# Patient Record
Sex: Male | Born: 1985
Health system: Southern US, Community
[De-identification: ages and names within clinical notes are randomized; demographics above are authoritative.]

## PROBLEM LIST (undated history)

## (undated) DIAGNOSIS — J45909 Unspecified asthma, uncomplicated: Secondary | ICD-10-CM

## (undated) HISTORY — DX: Unspecified asthma, uncomplicated: J45.909

## (undated) HISTORY — PX: WISDOM TOOTH EXTRACTION: SHX21

---
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
## 2011-11-05 ENCOUNTER — Encounter (HOSPITAL_COMMUNITY): Payer: Self-pay | Admitting: Emergency Medicine

## 2011-11-05 DIAGNOSIS — Z0389 Encounter for observation for other suspected diseases and conditions ruled out: Secondary | ICD-10-CM | POA: Insufficient documentation

## 2011-11-05 NOTE — ED Notes (Signed)
Pt c/o shortness of breath, onset earlier today.  Pt denies asthma.  Productive cough x's 3 days. s

## 2011-11-06 ENCOUNTER — Emergency Department (HOSPITAL_COMMUNITY)
Admission: EM | Admit: 2011-11-06 | Discharge: 2011-11-06 | Discharge status: Against Medical Advice | Payer: Medicaid Other | Attending: Emergency Medicine | Admitting: Emergency Medicine

## 2011-11-06 ENCOUNTER — Emergency Department (HOSPITAL_COMMUNITY): Payer: Medicaid Other

## 2011-11-06 DIAGNOSIS — R0602 Shortness of breath: Secondary | ICD-10-CM

## 2011-11-06 MED ORDER — ALBUTEROL SULFATE (5 MG/ML) 0.5% IN NEBU
5.0000 mg | INHALATION_SOLUTION | Freq: Once | RESPIRATORY_TRACT | Status: AC
  Administered 2011-11-06: 5 mg via RESPIRATORY_TRACT
  Filled 2011-11-06: qty 40

## 2011-11-06 MED ORDER — PREDNISONE 20 MG PO TABS
60.0000 mg | ORAL_TABLET | Freq: Once | ORAL | Status: AC
  Administered 2011-11-06: 60 mg via ORAL
  Filled 2011-11-06: qty 3

## 2011-11-06 NOTE — ED Provider Notes (Signed)
Patient left AMA prior to my evaluation  Olivia Mackie, MD 11/06/11 [phone]

## 2011-11-06 NOTE — ED Notes (Signed)
NEBULIZER TREATMENT IN PROGRESS, BREATHING EASIER ,  RESPIRATIONS UNLABORED .

## 2012-10-23 ENCOUNTER — Emergency Department (HOSPITAL_COMMUNITY): Payer: Medicaid Other

## 2012-10-23 ENCOUNTER — Encounter (HOSPITAL_COMMUNITY): Payer: Self-pay | Admitting: *Deleted

## 2012-10-23 ENCOUNTER — Observation Stay (HOSPITAL_COMMUNITY)
Admission: EM | Admit: 2012-10-23 | Discharge: 2012-10-24 | Disposition: A | Discharge status: Home / Self Care | Payer: Medicaid Other | Attending: Internal Medicine | Admitting: Internal Medicine

## 2012-10-23 DIAGNOSIS — J4551 Severe persistent asthma with (acute) exacerbation: Secondary | ICD-10-CM

## 2012-10-23 DIAGNOSIS — Z23 Encounter for immunization: Secondary | ICD-10-CM | POA: Insufficient documentation

## 2012-10-23 DIAGNOSIS — J45901 Unspecified asthma with (acute) exacerbation: Principal | ICD-10-CM

## 2012-10-23 DIAGNOSIS — J209 Acute bronchitis, unspecified: Secondary | ICD-10-CM

## 2012-10-23 DIAGNOSIS — R0602 Shortness of breath: Secondary | ICD-10-CM | POA: Insufficient documentation

## 2012-10-23 DIAGNOSIS — Z720 Tobacco use: Secondary | ICD-10-CM | POA: Diagnosis present

## 2012-10-23 DIAGNOSIS — J4541 Moderate persistent asthma with (acute) exacerbation: Secondary | ICD-10-CM

## 2012-10-23 DIAGNOSIS — F17200 Nicotine dependence, unspecified, uncomplicated: Secondary | ICD-10-CM

## 2012-10-23 LAB — BASIC METABOLIC PANEL
BUN: 8 mg/dL (ref 6–23)
CO2: 24 mEq/L (ref 19–32)
Chloride: 101 mEq/L (ref 96–112)
Creatinine, Ser: 0.74 mg/dL (ref 0.50–1.35)
Glucose, Bld: 113 mg/dL — ABNORMAL HIGH (ref 70–99)

## 2012-10-23 LAB — CBC WITH DIFFERENTIAL/PLATELET
Basophils Relative: 0 % (ref 0–1)
HCT: 46.5 % (ref 39.0–52.0)
Hemoglobin: 16.9 g/dL (ref 13.0–17.0)
Lymphocytes Relative: 7 % — ABNORMAL LOW (ref 12–46)
Lymphs Abs: 1 10*3/uL (ref 0.7–4.0)
MCHC: 36.3 g/dL — ABNORMAL HIGH (ref 30.0–36.0)
Monocytes Absolute: 0.8 10*3/uL (ref 0.1–1.0)
Monocytes Relative: 5 % (ref 3–12)
Neutro Abs: 13 10*3/uL — ABNORMAL HIGH (ref 1.7–7.7)
RBC: 5.03 MIL/uL (ref 4.22–5.81)

## 2012-10-23 MED ORDER — BUDESONIDE-FORMOTEROL FUMARATE 80-4.5 MCG/ACT IN AERO
2.0000 | INHALATION_SPRAY | Freq: Two times a day (BID) | RESPIRATORY_TRACT | Status: DC
  Administered 2012-10-23 – 2012-10-24 (×2): 2 via RESPIRATORY_TRACT
  Filled 2012-10-23 (×2): qty 6.9

## 2012-10-23 MED ORDER — ALBUTEROL SULFATE (5 MG/ML) 0.5% IN NEBU
5.0000 mg | INHALATION_SOLUTION | Freq: Once | RESPIRATORY_TRACT | Status: AC
  Administered 2012-10-23: 5 mg via RESPIRATORY_TRACT
  Filled 2012-10-23: qty 1

## 2012-10-23 MED ORDER — MAGNESIUM SULFATE 40 MG/ML IJ SOLN
2.0000 g | Freq: Once | INTRAMUSCULAR | Status: AC
  Administered 2012-10-23: 2 g via INTRAVENOUS
  Filled 2012-10-23: qty 50

## 2012-10-23 MED ORDER — ALBUTEROL SULFATE (5 MG/ML) 0.5% IN NEBU
2.5000 mg | INHALATION_SOLUTION | Freq: Four times a day (QID) | RESPIRATORY_TRACT | Status: DC
  Administered 2012-10-23 – 2012-10-24 (×4): 2.5 mg via RESPIRATORY_TRACT
  Filled 2012-10-23 (×4): qty 0.5

## 2012-10-23 MED ORDER — METHYLPREDNISOLONE SODIUM SUCC 125 MG IJ SOLR
125.0000 mg | Freq: Once | INTRAMUSCULAR | Status: AC
  Administered 2012-10-23: 125 mg via INTRAVENOUS
  Filled 2012-10-23: qty 2

## 2012-10-23 MED ORDER — ACETAMINOPHEN 325 MG PO TABS: 650.0000 mg | ORAL_TABLET | Freq: Four times a day (QID) | ORAL | Status: DC | PRN

## 2012-10-23 MED ORDER — PREDNISONE 20 MG PO TABS
40.0000 mg | ORAL_TABLET | Freq: Two times a day (BID) | ORAL | Status: DC
  Administered 2012-10-23 – 2012-10-24 (×2): 40 mg via ORAL
  Filled 2012-10-23 (×4): qty 2

## 2012-10-23 MED ORDER — ACETAMINOPHEN 650 MG RE SUPP: 650.0000 mg | Freq: Four times a day (QID) | RECTAL | Status: DC | PRN

## 2012-10-23 MED ORDER — ALBUTEROL SULFATE (5 MG/ML) 0.5% IN NEBU: 5.0000 mg | INHALATION_SOLUTION | Freq: Once | RESPIRATORY_TRACT | Status: DC

## 2012-10-23 MED ORDER — PNEUMOCOCCAL VAC POLYVALENT 25 MCG/0.5ML IJ INJ
0.5000 mL | INJECTION | INTRAMUSCULAR | Status: AC
  Administered 2012-10-24: 0.5 mL via INTRAMUSCULAR
  Filled 2012-10-23: qty 0.5

## 2012-10-23 MED ORDER — DOXYCYCLINE HYCLATE 100 MG PO CAPS: 100.0000 mg | ORAL_CAPSULE | Freq: Two times a day (BID) | ORAL | Status: DC

## 2012-10-23 MED ORDER — GUAIFENESIN ER 600 MG PO TB12
600.0000 mg | ORAL_TABLET | Freq: Two times a day (BID) | ORAL | Status: DC
  Administered 2012-10-23 – 2012-10-24 (×2): 600 mg via ORAL
  Filled 2012-10-23 (×4): qty 1

## 2012-10-23 MED ORDER — ALBUTEROL SULFATE (5 MG/ML) 0.5% IN NEBU
INHALATION_SOLUTION | RESPIRATORY_TRACT | Status: AC
  Filled 2012-10-23: qty 1

## 2012-10-23 MED ORDER — IPRATROPIUM BROMIDE 0.02 % IN SOLN
0.5000 mg | Freq: Four times a day (QID) | RESPIRATORY_TRACT | Status: DC
  Administered 2012-10-23 – 2012-10-24 (×4): 0.5 mg via RESPIRATORY_TRACT
  Filled 2012-10-23 (×4): qty 2.5

## 2012-10-23 MED ORDER — IPRATROPIUM BROMIDE 0.02 % IN SOLN
0.5000 mg | Freq: Once | RESPIRATORY_TRACT | Status: AC
  Administered 2012-10-23: 0.5 mg via RESPIRATORY_TRACT
  Filled 2012-10-23: qty 2.5

## 2012-10-23 MED ORDER — IPRATROPIUM BROMIDE 0.02 % IN SOLN: 0.5000 mg | Freq: Once | RESPIRATORY_TRACT | Status: DC

## 2012-10-23 MED ORDER — ALBUTEROL (5 MG/ML) CONTINUOUS INHALATION SOLN
10.0000 mg/h | INHALATION_SOLUTION | Freq: Once | RESPIRATORY_TRACT | Status: AC
  Administered 2012-10-23: 10 mg/h via RESPIRATORY_TRACT
  Filled 2012-10-23: qty 20

## 2012-10-23 MED ORDER — ALBUTEROL SULFATE (5 MG/ML) 0.5% IN NEBU
5.0000 mg | INHALATION_SOLUTION | Freq: Once | RESPIRATORY_TRACT | Status: AC
  Administered 2012-10-23: 5 mg via RESPIRATORY_TRACT

## 2012-10-23 NOTE — H&P (Signed)
[facility] History and Physical  William May ZOX:[id number] DOB: 26-Apr-1985 DOA: 10/23/2012  Referring physician: EDP PCP: Thora Lance, MD   Chief Complaint: shortness of breath  HPI: William May is a 27 y.o. male, smoker with a reported history of asthma who presents with wheezing and shortness of breath. He smokes about 2 packs of cigarettes a day. Also he smokes marijuana occasionally. He started feeling a "tickle" in his throat today. He has a cough but is vague about severity or sputum production. He was started on Symbicort and albuterol last week by his primary care provider. He's had no sinus congestion. No fevers or chills. no sick contacts. Never been hospitalized for asthma. Has received Solu-Medrol and several breathing treatments, but continues to have coarse breath sounds and hypoxia. His oxygen saturations were in the high 80s. Currently 96% on 2 L nasal cannula.  Initial bloodwork hemolyzed. Repeat potassium normal  Review of Systems: Systems reviewed and as above otherwise negative per  Family history: Diabetes, hypertension, cancer  Past Surgical History  Procedure Laterality Date  . Wisdom tooth extraction     Social History: Smokes 2 packs of cigarettes daily. Smokes marijuana. Denies heavy alcohol use but does drink occasionally. Unemployed.  No Known Allergies   Prior to Admission medications   Medication Sig Start Date End Date Taking? Authorizing Provider  albuterol (PROVENTIL HFA;VENTOLIN HFA) 108 (90 BASE) MCG/ACT inhaler Inhale 2 puffs into the lungs every 6 (six) hours as needed for wheezing.   Yes Historical Provider, MD  budesonide-formoterol (SYMBICORT) 80-4.5 MCG/ACT inhaler Inhale 2 puffs into the lungs 2 (two) times daily.   Yes Historical Provider, MD   Physical Exam: Filed Vitals:   10/23/12 1430 10/23/12 1530 10/23/12 1640 10/23/12 1719  BP: 108/82 121/68 110/57   Pulse: 118 96 96   Temp:   97.9 F (36.6 C)   TempSrc:    Oral   Resp:   16   SpO2: 100% 95% 94% 93%   BP 110/57  Pulse 96  Temp(Src) 97.9 F (36.6 C) (Oral)  Resp 16  SpO2 93%  General Appearance:    comfortable appearing African American male, very distracted, watching TV. Largely cooperative but not very communicative.   Head:    Normocephalic, without obvious abnormality, atraumatic  Eyes:    PERRL, conjunctiva/corneas clear, EOM's intact, fundi    benign, both eyes          Nose:   Nares normal, septum midline, mucosa normal, no drainage   or sinus tenderness  Throat:   Lips, mucosa, and tongue normal; teeth and gums normal  Neck:   Supple, symmetrical, trachea midline, no adenopathy;       thyroid:  No enlargement/tenderness/nodules; no carotid   bruit or JVD  Back:     coarse. No rales. Minimal wheeze. Good air movement.   Lungs:     Clear to auscultation bilaterally, respirations unlabored  Chest wall:    No tenderness or deformity  Heart:    Regular rate and rhythm, S1 and S2 normal, no murmur, rub   or gallop  Abdomen:     Soft, non-tender, bowel sounds active all four quadrants,    no masses, no organomegaly  Genitalia:   deferred   Rectal:   deferred   Extremities:   Extremities normal, atraumatic, no cyanosis or edema  Pulses:   2+ and symmetric all extremities  Skin:   Skin color, texture, turgor normal, no rashes or lesions  Lymph  nodes:   Cervical, supraclavicular, and axillary nodes normal  Neurologic:   CNII-XII intact. Normal strength, sensation and reflexes      throughout    psychiatric: Cooperative. Flat affect.  Labs on Admission:  Basic Metabolic Panel:  Recent Labs Lab 10/23/12 1305  NA 136  K 6.6*  CL 101  CO2 24  GLUCOSE 113*  BUN 8  CREATININE 0.74  CALCIUM 9.2   Liver Function Tests: No results found for this basename: AST, ALT, ALKPHOS, BILITOT, PROT, ALBUMIN,  in the last 168 hours No results found for this basename: LIPASE, AMYLASE,  in the last 168 hours No results found for this  basename: AMMONIA,  in the last 168 hours CBC:  Recent Labs Lab 10/23/12 1305  WBC 15.2*  NEUTROABS 13.0*  HGB 16.9  HCT 46.5  MCV 92.4  PLT 290   Cardiac Enzymes: No results found for this basename: CKTOTAL, CKMB, CKMBINDEX, TROPONINI,  in the last 168 hours  BNP (last 3 results) No results found for this basename: PROBNP,  in the last 8760 hours CBG: No results found for this basename: GLUCAP,  in the last 168 hours  Radiological Exams on Admission: Dg Chest 2 View  10/23/2012   *RADIOLOGY REPORT*  Clinical Data: Cough and shortness of breath.  CHEST - 2 VIEW  Comparison: Chest x-ray 11/06/2011.  Findings: Lung volumes are normal.  No consolidative airspace disease.  No pleural effusions.  No pneumothorax.  No pulmonary nodule or mass noted.  Pulmonary vasculature and the cardiomediastinal silhouette are within normal limits.  IMPRESSION: 1. No radiographic evidence of acute cardiopulmonary disease.   Original Report Authenticated By: Trudie Reed, M.D.   Assessment/Plan Principal Problem:   Asthma exacerbation Active Problems:   Tobacco abuse Erroneous potassium level: istat K normal  Observation, albuterol and atrovent (heavy smoker), prednisone, O2, mucolytics. Tobacco cessation counseling  Code Status: full Family Communication: grandmother Disposition Plan: home  Time spent: 45 min  Christiane Ha [facility] Pager [phone]  If 7PM-7AM, please contact night-coverage [email or web address] Password [facility] 10/23/2012, 6:38 PM

## 2012-10-23 NOTE — ED Notes (Signed)
Reports not feeling well since yesterday, having upper resp congestion and cough. Denies hx of asthma. Airway intact at triage.

## 2012-10-23 NOTE — ED Provider Notes (Signed)
CSN: [id number]     Arrival date & time 10/23/12  1042 History     First MD Initiated Contact with Patient 10/23/12 1059     Chief Complaint  Patient presents with  . URI   (Consider location/radiation/quality/duration/timing/severity/associated sxs/prior Treatment) HPI Comments: Patient presents with a chief complaint of productive cough and shortness of breath.  He reports that his symptoms began four days ago and have been progressively worsening since that time.   He states that he was seen by his PCP for this four days ago and was given Symbicort and an Albuterol inhaler, which he reports that he has been using.  When asked if he has a history of Asthma he states, "yes but I don't like to admit it."  He denies any prior hospitalizations or intubations.  He denies fever or chills.  Denies chest pain.  Denies hemoptysis.  Denies prolonged travel or surgeries in the past 4 weeks.  No prior history of DVT or PE.  No lower extremity edema or pain.  He smokes daily.   Smokes 2 ppd.    The history is provided by the patient.    History reviewed. No pertinent past medical history. Past Surgical History  Procedure Laterality Date  . Wisdom tooth extraction     History reviewed. No pertinent family history. History  Substance Use Topics  . Smoking status: Current Every Day Smoker  . Smokeless tobacco: Not on file  . Alcohol Use: No    Review of Systems  Constitutional: Negative for fever and chills.  HENT: Positive for congestion and rhinorrhea. Negative for sinus pressure.   Respiratory: Positive for cough and shortness of breath.   Cardiovascular: Negative for chest pain and leg swelling.  All other systems reviewed and are negative.    Allergies  Review of patient's allergies indicates no known allergies.  Home Medications   Current Outpatient Rx  Name  Route  Sig  Dispense  Refill  . albuterol (PROVENTIL HFA;VENTOLIN HFA) 108 (90 BASE) MCG/ACT inhaler   Inhalation  Inhale 2 puffs into the lungs every 6 (six) hours as needed for wheezing.         . budesonide-formoterol (SYMBICORT) 80-4.5 MCG/ACT inhaler   Inhalation   Inhale 2 puffs into the lungs 2 (two) times daily.          BP 136/78  Pulse 89  Temp(Src) 99 F (37.2 C) (Oral)  Resp 22  SpO2 90% Physical Exam  Nursing note and vitals reviewed. Constitutional: He appears well-developed and well-nourished.  HENT:  Head: Normocephalic and atraumatic.  Right Ear: Tympanic membrane and ear canal normal.  Left Ear: Tympanic membrane and ear canal normal.  Nose: Nose normal.  Mouth/Throat: Uvula is midline, oropharynx is clear and moist and mucous membranes are normal.  Neck: Normal range of motion. Neck supple.  Cardiovascular: Normal rate, regular rhythm and normal heart sounds.   Pulmonary/Chest: Effort normal. No accessory muscle usage. Not tachypneic. No respiratory distress. He has decreased breath sounds. He has wheezes. He has no rales. He exhibits no tenderness.  Patient speaking in complete sentences Diffuse expiratory wheezing  Neurological: He is alert.  Skin: Skin is warm and dry.  Psychiatric: He has a normal mood and affect.    ED Course   Procedures (including critical care time)  Labs Reviewed - No data to display No results found. No diagnosis found.  12:30 PM Reassessed patient after second breathing treatment.  Patient continues to have expiratory wheezing.  Oxygen sat 89 on RA.  Will order continuous neb and reassess.  2:12 PM Potassium resulted as 6.6.  However, lab reports that it looked hemolyzed.  Will recheck.  2:30 PM Reassessed patient after continuous neb.  Patient continues to have expiratory wheezing.  Pulse ox continues to be 89 on RA.  3:12 PM Repeat Potassium 3.8  3:13 PM Discussed with Dr. Lendell Caprice with [facility] who has agreed to admit the patient.  MDM  Patient with a history of Asthma presents today with Asthma Exacerbation.   Patient given 125 mg Solumedrol, multiple breathing treatments, and Magnesium in the ED without improvement.  CXR negative.  Patient admitted to [facility] for additional management of Asthma Exacerbation.    Pascal Lux [place], PA-C 10/23/12 1939

## 2012-10-23 NOTE — ED Notes (Signed)
Notified PA, Anne Shutter of wheezing and 89% saturation on room air.

## 2012-10-23 NOTE — ED Notes (Signed)
O2 sat back down to 88% on Room air. Placed back on [place] 2L; improved to 91%.

## 2012-10-23 NOTE — ED Notes (Signed)
Patient returned from X-ray 

## 2012-10-23 NOTE — ED Notes (Signed)
Patient transported to X-ray 

## 2012-10-23 NOTE — ED Notes (Signed)
PA at bedside.

## 2012-10-23 NOTE — ED Notes (Signed)
Charge nurse notified of need move pt  for central monitoring and continuous neb.

## 2012-10-24 DIAGNOSIS — J209 Acute bronchitis, unspecified: Secondary | ICD-10-CM

## 2012-10-24 MED ORDER — LEVOFLOXACIN 500 MG PO TABS: 500.0000 mg | ORAL_TABLET | Freq: Every day | ORAL | Status: DC

## 2012-10-24 MED ORDER — PREDNISONE (PAK) 10 MG PO TABS: 10.0000 mg | ORAL_TABLET | Freq: Every day | ORAL | Status: DC

## 2012-10-24 MED ORDER — GUAIFENESIN ER 600 MG PO TB12: 600.0000 mg | ORAL_TABLET | Freq: Two times a day (BID) | ORAL | Status: DC | PRN

## 2012-10-24 MED ORDER — ALBUTEROL SULFATE HFA 108 (90 BASE) MCG/ACT IN AERS: 2.0000 | INHALATION_SPRAY | RESPIRATORY_TRACT | Status: DC | PRN

## 2012-10-24 MED ORDER — LEVOFLOXACIN 500 MG PO TABS
500.0000 mg | ORAL_TABLET | Freq: Every day | ORAL | Status: DC
  Filled 2012-10-24: qty 1

## 2012-10-24 MED ORDER — ACETAMINOPHEN 325 MG PO TABS: 650.0000 mg | ORAL_TABLET | Freq: Four times a day (QID) | ORAL | Status: DC | PRN

## 2012-10-24 NOTE — ED Provider Notes (Signed)
Medical screening examination/treatment/procedure(s) were conducted as a shared visit with non-physician practitioner(s) and myself.  I personally evaluated the patient during the encounter   Loren Racer, MD 10/24/12 [phone]

## 2012-10-24 NOTE — Progress Notes (Signed)
Patient given discharge instructions and prescriptions.  Patient verbalized understanding of reasons to return.  Will f/u as needed.  Roland Rack, RN

## 2012-10-24 NOTE — Discharge Summary (Signed)
Physician Discharge Summary  William May YQI:[id number] DOB: Aug 20, 1985 DOA: 10/23/2012  PCP: Thora Lance, MD  Admit date: 10/23/2012 Discharge date: 10/24/2012  Discharge Diagnoses:  Principal Problem:   Asthma exacerbation Active Problems:   Tobacco abuse   Acute bronchitis  Discharge Condition: stable  There were no vitals filed for this visit.  History of present illness:  William May is a 27 y.o. male, smoker with a reported history of asthma who presents with wheezing and shortness of breath. He smokes about 2 packs of cigarettes a day. Also he smokes marijuana occasionally. He started feeling a "tickle" in his throat today. He has a cough but is vague about severity or sputum production. He was started on Symbicort and albuterol last week by his primary care provider. He's had no sinus congestion. No fevers or chills. no sick contacts. Never been hospitalized for asthma. Has received Solu-Medrol and several breathing treatments, but continues to have coarse breath sounds and hypoxia. His oxygen saturations were in the high 80s. Currently 96% on 2 L nasal cannula. Initial bloodwork hemolyzed. Repeat potassium normal   Hospital Course:  Observed overnight. Steroids, bronchodilators oxygen continued.  Symptoms of URI reported, so started on abx.  Improved overnight, and safe to discharge.  Discharge Exam: Filed Vitals:   10/23/12 2222 10/23/12 2350 10/24/12 0621 10/24/12 0842  BP: 113/69  131/72   Pulse: 91  84   Temp: 98.6 F (37 C)  98.5 F (36.9 C)   TempSrc: Oral  Oral   Resp: 17  18   SpO2: 93% 93% 93% 92%    General: comfortable. Cardiovascular: RRR without WRR Respiratory: good air movement. occ wheeze  Discharge Instructions  Discharge Orders   Future Orders Complete By Expires     Diet general  As directed     Discharge instructions  As directed     Comments:      Quit smoking    Increase activity slowly  As directed         Medication  List         acetaminophen 325 MG tablet  Commonly known as:  TYLENOL  Take 2 tablets (650 mg total) by mouth every 6 (six) hours as needed.     albuterol 108 (90 BASE) MCG/ACT inhaler  Commonly known as:  PROVENTIL HFA;VENTOLIN HFA  Inhale 2 puffs into the lungs every 4 (four) hours as needed for wheezing.     budesonide-formoterol 80-4.5 MCG/ACT inhaler  Commonly known as:  SYMBICORT  Inhale 2 puffs into the lungs 2 (two) times daily.     guaiFENesin 600 MG 12 hr tablet  Commonly known as:  MUCINEX  Take 1 tablet (600 mg total) by mouth 2 (two) times daily as needed for congestion.     levofloxacin 500 MG tablet  Commonly known as:  LEVAQUIN  Take 1 tablet (500 mg total) by mouth daily.  Start taking on:  10/25/2012     predniSONE 10 MG tablet  Commonly known as:  STERAPRED UNI-PAK  Take 1 tablet (10 mg total) by mouth daily. 4 tablets daily then decrease by 1 tablet every 2 days until off       No Known Allergies     Follow-up Information   Follow up with Thora Lance, MD. (If symptoms worsen)    Contact information:   [address] [place] [address] [phone]        The results of significant diagnostics from this hospitalization (including  imaging, microbiology, ancillary and laboratory) are listed below for reference.    Significant Diagnostic Studies: Dg Chest 2 View  10/23/2012   *RADIOLOGY REPORT*  Clinical Data: Cough and shortness of breath.  CHEST - 2 VIEW  Comparison: Chest x-ray 11/06/2011.  Findings: Lung volumes are normal.  No consolidative airspace disease.  No pleural effusions.  No pneumothorax.  No pulmonary nodule or mass noted.  Pulmonary vasculature and the cardiomediastinal silhouette are within normal limits.  IMPRESSION: 1. No radiographic evidence of acute cardiopulmonary disease.   Original Report Authenticated By: Trudie Reed, M.D.    Microbiology: No results found for this or any previous visit (from the past 240  hour(s)).   Labs: Basic Metabolic Panel:  Recent Labs Lab 10/23/12 1305  NA 136  K 6.6*  CL 101  CO2 24  GLUCOSE 113*  BUN 8  CREATININE 0.74  CALCIUM 9.2   Liver Function Tests: No results found for this basename: AST, ALT, ALKPHOS, BILITOT, PROT, ALBUMIN,  in the last 168 hours No results found for this basename: LIPASE, AMYLASE,  in the last 168 hours No results found for this basename: AMMONIA,  in the last 168 hours CBC:  Recent Labs Lab 10/23/12 1305  WBC 15.2*  NEUTROABS 13.0*  HGB 16.9  HCT 46.5  MCV 92.4  PLT 290   Signed:  Nikhil Osei L  [facility] 10/24/2012, 10:19 AM

## 2013-04-23 ENCOUNTER — Ambulatory Visit: Payer: Medicaid Other | Attending: Internal Medicine | Admitting: Internal Medicine

## 2013-04-23 VITALS — BP 114/75 | HR 85 | Temp 98.9°F | Resp 14 | Ht 66.0 in | Wt 136.2 lb

## 2013-04-23 DIAGNOSIS — J45909 Unspecified asthma, uncomplicated: Secondary | ICD-10-CM

## 2013-04-23 DIAGNOSIS — F17200 Nicotine dependence, unspecified, uncomplicated: Secondary | ICD-10-CM

## 2013-04-23 MED ORDER — BUDESONIDE-FORMOTEROL FUMARATE 80-4.5 MCG/ACT IN AERO: 2.0000 | INHALATION_SPRAY | Freq: Two times a day (BID) | RESPIRATORY_TRACT | Status: DC

## 2013-04-23 MED ORDER — ALBUTEROL SULFATE HFA 108 (90 BASE) MCG/ACT IN AERS: 2.0000 | INHALATION_SPRAY | RESPIRATORY_TRACT | Status: DC | PRN

## 2013-04-23 NOTE — Patient Instructions (Signed)
Nicotine Addiction Nicotine can act as both a stimulant (excites/activates) and a sedative (calms/quiets). Immediately after exposure to nicotine, there is a "kick" caused in part by the drug's stimulation of the adrenal glands and resulting discharge of adrenaline (epinephrine). The rush of adrenaline stimulates the body and causes a sudden release of sugar. This means that smokers are always slightly hyperglycemic. Hyperglycemic means that the blood sugar is high, just like in diabetics. Nicotine also decreases the amount of insulin which helps control sugar levels in the body. There is an increase in blood pressure, breathing, and the rate of heart beats.  In addition, nicotine indirectly causes a release of dopamine in the brain that controls pleasure and motivation. A similar reaction is seen with other drugs of abuse, such as cocaine and heroin. This dopamine release is thought to cause the pleasurable sensations when smoking. In some different cases, nicotine can also create a calming effect, depending on sensitivity of the smoker's nervous system and the dose of nicotine taken. WHAT HAPPENS WHEN NICOTINE IS TAKEN FOR LONG PERIODS OF TIME?  Long-term use of nicotine results in addiction. It is difficult to stop.  Repeated use of nicotine creates tolerance. Higher doses of nicotine are needed to get the "kick." When nicotine use is stopped, withdrawal may last a month or more. Withdrawal may begin within a few hours after the last cigarette. Symptoms peak within the first few days and may lessen within a few weeks. For some people, however, symptoms may last for months or longer. Withdrawal symptoms include:   Irritability.  Craving.  Learning and attention deficits.  Sleep disturbances.  Increased appetite. Craving for tobacco may last for 6 months or longer. Many behaviors done while using nicotine can also play a part in the severity of withdrawal symptoms. For some people, the feel,  smell, and sight of a cigarette and the ritual of obtaining, handling, lighting, and smoking the cigarette are closely linked with the pleasure of smoking. When stopped, they also miss the related behaviors which make the withdrawal or craving worse. While nicotine gum and patches may lessen the drug aspects of withdrawal, cravings often persist. WHAT ARE THE MEDICAL CONSEQUENCES OF NICOTINE USE?  Nicotine addiction accounts for one-third of all cancers. The top cancer caused by tobacco is lung cancer. Lung cancer is the number one cancer killer of both men and women.  Smoking is also associated with cancers of the:  Mouth.  Pharynx.  Larynx.  Esophagus.  Stomach.  Pancreas.  Cervix.  Kidney.  Ureter.  Bladder.  Smoking also causes lung diseases such as lasting (chronic) bronchitis and emphysema.  It worsens asthma in adults and children.  Smoking increases the risk of heart disease, including:  Stroke.  Heart attack.  Vascular disease.  Aneurysm.  Passive or secondary smoke can also increase medical risks including:  Asthma in children.  Sudden Infant Death Syndrome (SIDS).  Additionally, dropped cigarettes are the leading cause of residential fire fatalities.  Nicotine poisoning has been reported from accidental ingestion of tobacco products by children and pets. Death usually results in a few minutes from respiratory failure (when a person stops breathing) caused by paralysis. TREATMENT   Medication. Nicotine replacement medicines such as nicotine gum and the patch are used to stop smoking. These medicines gradually lower the dosage of nicotine in the body. These medicines do not contain the carbon monoxide and other toxins found in tobacco smoke.  Hypnotherapy.  Relaxation therapy.  Nicotine Anonymous (a 12-step support   program). Find times and locations in your local yellow pages. Document Released: 11/21/2003 Document Revised: 06/09/2011 Document  Reviewed: 04/14/2007 [facility] Patient Information 2014 [place], [place].  Asthma, Adult Asthma is a recurring condition in which the airways tighten and narrow. Asthma can make it difficult to breathe. It can cause coughing, wheezing, and shortness of breath. Asthma episodes (also called asthma attacks) range from minor to life-threatening. Asthma cannot be cured, but medicines and lifestyle changes can help control it. CAUSES Asthma is believed to be caused by inherited (genetic) and environmental factors, but its exact cause is unknown. Asthma may be triggered by allergens, lung infections, or irritants in the air. Asthma triggers are different for each person. Common triggers include:   Animal dander.  Dust mites.  Cockroaches.  Pollen from trees or grass.  Mold.  Smoke.  Air pollutants such as dust, household cleaners, hair sprays, aerosol sprays, paint fumes, strong chemicals, or strong odors.  Cold air, weather changes, and winds (which increase molds and pollens in the air).  Strong emotional expressions such as crying or laughing hard.  Stress.  Certain medicines (such as aspirin) or types of drugs (such as beta-blockers).  Sulfites in foods and drinks. Foods and drinks that may contain sulfites include dried fruit, potato chips, and sparkling grape juice.  Infections or inflammatory conditions such as the flu, a cold, or an inflammation of the nasal membranes (rhinitis).  Gastroesophageal reflux disease (GERD).  Exercise or strenuous activity. SYMPTOMS Symptoms may occur immediately after asthma is triggered or many hours later. Symptoms include:  Wheezing.  Excessive nighttime or early morning coughing.  Frequent or severe coughing with a common cold.  Chest tightness.  Shortness of breath. DIAGNOSIS  The diagnosis of asthma is made by a review of your medical history and a physical exam. Tests may also be performed. These may include:  Lung function  studies. These tests show how much air you breath in and out.  Allergy tests.  Imaging tests such as X-rays. TREATMENT  Asthma cannot be cured, but it can usually be controlled. Treatment involves identifying and avoiding your asthma triggers. It also involves medicines. There are 2 classes of medicine used for asthma treatment:   Controller medicines. These prevent asthma symptoms from occurring. They are usually taken every day.  Reliever or rescue medicines. These quickly relieve asthma symptoms. They are used as needed and provide short-term relief. Your health care provider will help you create an asthma action plan. An asthma action plan is a written plan for managing and treating your asthma attacks. It includes a list of your asthma triggers and how they may be avoided. It also includes information on when medicines should be taken and when their dosage should be changed. An action plan may also involve the use of a device called a peak flow meter. A peak flow meter measures how well the lungs are working. It helps you monitor your condition. HOME CARE INSTRUCTIONS   Take medicine as directed by your health care provider. Speak with your health care provider if you have questions about how or when to take the medicines.  Use a peak flow meter as directed by your health care provider. Record and keep track of readings.  Understand and use the action plan to help minimize or stop an asthma attack without needing to seek medical care.  Control your home environment in the following ways to help prevent asthma attacks:  Do not smoke. Avoid being exposed to secondhand smoke.  Change your heating and air conditioning filter regularly.  Limit your use of fireplaces and wood stoves.  Get rid of pests (such as roaches and mice) and their droppings.  Throw away plants if you see mold on them.  Clean your floors and dust regularly. Use unscented cleaning products.  Try to have someone  else vacuum for you regularly. Stay out of rooms while they are being vacuumed and for a short while afterward. If you vacuum, use a dust mask from a hardware store, a double-layered or microfilter vacuum cleaner bag, or a vacuum cleaner with a HEPA filter.  Replace carpet with wood, tile, or vinyl flooring. Carpet can trap dander and dust.  Use allergy-proof pillows, mattress covers, and box spring covers.  Wash bed sheets and blankets every week in hot water and dry them in a dryer.  Use blankets that are made of polyester or cotton.  Clean bathrooms and kitchens with bleach. If possible, have someone repaint the walls in these rooms with mold-resistant paint. Keep out of the rooms that are being cleaned and painted.  Wash hands frequently. SEEK MEDICAL CARE IF:   You have wheezing, shortness of breath, or a cough even if taking medicine to prevent attacks.  The colored mucus you cough up (sputum) is thicker than usual.  Your sputum changes from clear or white to yellow, green, gray, or bloody.  You have any problems that may be related to the medicines you are taking (such as a rash, itching, swelling, or trouble breathing).  You are using a reliever medicine more than 2 3 times per week.  Your peak flow is still at 50 79% of you personal best after following your action plan for 1 hour. SEEK IMMEDIATE MEDICAL CARE IF:   You seem to be getting worse and are unresponsive to treatment during an asthma attack.  You are short of breath even at rest.  You get short of breath when doing very little physical activity.  You have difficulty eating, drinking, or talking due to asthma symptoms.  You develop chest pain.  You develop a fast heartbeat.  You have a bluish color to your lips or fingernails.  You are lightheaded, dizzy, or faint.  Your peak flow is less than 50% of your personal best.  You have a fever or persistent symptoms for more than 2 3 days.  You have a fever  and symptoms suddenly get worse. MAKE SURE YOU:   Understand these instructions.  Will watch your condition.  Will get help right away if you are not doing well or get worse. Document Released: 03/17/2005 Document Revised: 11/17/2012 Document Reviewed: 10/14/2012 [facility] Patient Information 2014 [place], [place].

## 2013-04-23 NOTE — Progress Notes (Unsigned)
Patient ID: William May, male   DOB: [birth year], 28 y.o.   MRN: [id number]   HPI:  28 y.o with asthma, who smokes presents to establish care. Symptoms are controlled with current medications and he is able to exert himself without difficulty.   No Known Allergies Past Medical History  Diagnosis Date  . Asthma     History reviewed. No pertinent family history. History   Social History  . Marital Status: Single    Spouse Name: N/A    Number of Children: N/A  . Years of Education: N/A   Occupational History  . Not on file.   Social History Main Topics  . Smoking status: Current Every Day Smoker -- 2.00 packs/day for 7 years    Types: Cigarettes  . Smokeless tobacco: Not on file  . Alcohol Use: No  . Drug Use: No  . Sexual Activity: Not on file   Other Topics Concern  . Not on file   Social History Narrative  . No narrative on file   Current Outpatient Prescriptions on File Prior to Visit  Medication Sig Dispense Refill  . albuterol (PROVENTIL HFA;VENTOLIN HFA) 108 (90 BASE) MCG/ACT inhaler Inhale 2 puffs into the lungs every 4 (four) hours as needed for wheezing.      . budesonide-formoterol (SYMBICORT) 80-4.5 MCG/ACT inhaler Inhale 2 puffs into the lungs 2 (two) times daily.      Marland Kitchen. acetaminophen (TYLENOL) 325 MG tablet Take 2 tablets (650 mg total) by mouth every 6 (six) hours as needed.       No current facility-administered medications on file prior to visit.    Review of Systems  Constitutional: Negative for fever, chills, diaphoresis, activity change, appetite change and fatigue.  HENT: Negative for ear pain, nosebleeds, congestion, facial swelling, rhinorrhea, neck pain, neck stiffness and ear discharge.  Eyes: Negative for pain, discharge, redness, itching and visual disturbance.  Respiratory: Negative for cough, choking, chest tightness, shortness of breath, wheezing and stridor.  Cardiovascular: Negative for chest pain, palpitations and leg swelling.   Gastrointestinal: Negative for abdominal distention.  Genitourinary: Negative for dysuria, urgency, frequency, hematuria, flank pain, decreased urine volume, difficulty urinating and dyspareunia.  Musculoskeletal: negative for back pain, no joint swelling, arthralgias or gait problem.  Neurological: Negative for dizziness, tremors, seizures, syncope, facial asymmetry, speech difficulty, weakness, light-headedness, numbness and headaches.  Hematological: Negative for adenopathy. Does not bruise/bleed easily.  Psychiatric/Behavioral: Negative for hallucinations, behavioral problems, confusion, dysphoric mood   Objective:   Filed Vitals:   04/23/13 1025  BP: 114/75  Pulse: 85  Temp: 98.9 F (37.2 C)  Resp: 14   Filed Weights   04/23/13 1025  Weight: 136 lb 3.2 oz (61.78 kg)    Physical Exam ______ Constitutional: Appears well-developed and well-nourished. No distress. ____ HENT: Normocephalic. External right and left ear normal. Oropharynx is clear and moist. ____ Eyes: Conjunctivae and EOM are normal. PERRLA, no scleral icterus. ____ Neck: Normal ROM. Neck supple. No JVD. No tracheal deviation. No thyromegaly. ____ CVS: RRR, S1/S2 +, no murmurs, no gallops, no carotid bruit.  Pulmonary: Effort and breath sounds normal, no stridor, rhonchi, wheezes, rales.  Abdominal: Soft. BS +,  no distension, tenderness, rebound or guarding. ________ Musculoskeletal: Normal range of motion. No edema and no tenderness. ____ Neuro: Alert. Normal reflexes, muscle tone coordination. No cranial nerve deficit. Skin: Skin is warm and dry. No rash noted. Not diaphoretic. No erythema. No pallor. ____ Psychiatric: Normal mood and affect. Behavior, judgment, thought content  normal. __  Lab Results  Component Value Date   WBC 15.2* 10/23/2012   HGB 16.9 10/23/2012   HCT 46.5 10/23/2012   MCV 92.4 10/23/2012   PLT 290 10/23/2012   Lab Results  Component Value Date   CREATININE 0.74 10/23/2012   BUN 8  10/23/2012   NA 136 10/23/2012   K 6.6* 10/23/2012   CL 101 10/23/2012   CO2 24 10/23/2012    No results found for this basename: HGBA1C   Lipid Panel  No results found for this basename: chol, trig, hdl, cholhdl, vldl, ldlcalc        Patient Active Problem List   Diagnosis Date Noted  . Acute bronchitis 10/24/2012  . Asthma exacerbation 10/23/2012  . Tobacco abuse 10/23/2012     Preventative Medicine:   Flu vaccine: received  LAB WORK: declined    Assessment and plan:  Asthma - controlled- cont current meds- will give refills   Nicotine abuse - advised about complications of smoking and strongly encouraged to quit - pt appears agreeable to try to quit  Hyperkalemia - noted on labs in July- has declined to have it rechecked  - RTC as needed    Calvert Cantor, MD

## 2013-04-23 NOTE — Progress Notes (Unsigned)
Pt is here to establish care. Pt complains of shortness off breath last about 5 minutes and goes away x2 months. Currently using inhalers.

## 2014-09-07 ENCOUNTER — Encounter: Payer: Self-pay | Admitting: Family Medicine

## 2014-09-07 ENCOUNTER — Ambulatory Visit: Payer: Medicaid Other | Attending: Family Medicine | Admitting: Family Medicine

## 2014-09-07 VITALS — BP 131/83 | HR 68 | Temp 98.1°F | Resp 18 | Wt 130.0 lb

## 2014-09-07 DIAGNOSIS — Z720 Tobacco use: Secondary | ICD-10-CM

## 2014-09-07 DIAGNOSIS — Z114 Encounter for screening for human immunodeficiency virus [HIV]: Secondary | ICD-10-CM | POA: Diagnosis not present

## 2014-09-07 DIAGNOSIS — Z0000 Encounter for general adult medical examination without abnormal findings: Secondary | ICD-10-CM | POA: Diagnosis not present

## 2014-09-07 DIAGNOSIS — J45909 Unspecified asthma, uncomplicated: Secondary | ICD-10-CM

## 2014-09-07 DIAGNOSIS — Z23 Encounter for immunization: Secondary | ICD-10-CM

## 2014-09-07 MED ORDER — ALBUTEROL SULFATE HFA 108 (90 BASE) MCG/ACT IN AERS: 2.0000 | INHALATION_SPRAY | RESPIRATORY_TRACT | Status: DC | PRN

## 2014-09-07 MED ORDER — BUDESONIDE-FORMOTEROL FUMARATE 80-4.5 MCG/ACT IN AERO: 2.0000 | INHALATION_SPRAY | Freq: Two times a day (BID) | RESPIRATORY_TRACT | Status: DC

## 2014-09-07 NOTE — Progress Notes (Signed)
   Subjective:    Patient ID: William May, male    DOB: Feb 02, 1986, 29 y.o.   MRN: [id number] CC: asthma, smoker  HPI  1. Asthma: smoker. No wheezing or coughing. Denies daytime and nighttime symptoms. Reports that his asthma is well controlled.   2. Smoking: desires to quit. Smokes 2 PPD. Gets SOB when he sleeps on his back. Otherwise no SOB.   Soc Hx: current smoker 2 ppd  Review of Systems  Constitutional: Negative for fever and chills.  HENT: Negative for rhinorrhea.   Respiratory: Negative for cough, chest tightness, shortness of breath and wheezing.   Cardiovascular: Negative for chest pain.      Objective:   Physical Exam BP 131/83 mmHg  Pulse 68  Temp(Src) 98.1 F (36.7 C) (Oral)  Resp 18  Wt 130 lb (58.968 kg)  SpO2 96% General appearance: alert, cooperative and no distress Nose: no discharge, turbinates pink, swollen Throat: lips, mucosa, and tongue normal; teeth and gums normal Neck: no adenopathy, supple, symmetrical, trachea midline and thyroid not enlarged, symmetric, no tenderness/mass/nodules Lungs: clear to auscultation bilaterally Heart: regular rate and rhythm, S1, S2 normal, no murmur, click, rub or gallop       Assessment & Plan:

## 2014-09-07 NOTE — Patient Instructions (Addendum)
Mr. Markuson,  Thank you for coming in today. It was a pleasure meeting you. I look forward to being your primary doctor.  1. Smoking: heavier smoker, desires to quit.  Think about chantix, zyban, nicotine replacement  Smoking cessation support: smoking cessation hotline: 1-800-QUIT-NOW.  Smoking cessation classes are available through [facility]. Call [phone] or visit our website at [email or web address].   2. Asthma: well controlled Refilled albuterol for as needed use Refilled symbicort  3. Healthcare maintenance: tdap today Screening HIV today  F/u in 3 months for RN visit, flu shot  F/u in 6 months, sooner if needed for asthma   Dr. Armen Pickup

## 2014-09-07 NOTE — Assessment & Plan Note (Signed)
Smoking: heavier smoker, desires to quit.  Think about chantix, zyban, nicotine replacement  Smoking cessation support: smoking cessation hotline: 1-800-QUIT-NOW.  Smoking cessation classes are available through [facility]. Call [phone] or visit our website at [email or web address].

## 2014-09-07 NOTE — Assessment & Plan Note (Signed)
Asthma: well controlled Refilled albuterol for as needed use Refilled symbicort  3. Healthcare maintenance: tdap today Screening HIV today

## 2014-09-07 NOTE — Progress Notes (Signed)
Pt is here to refill his asthma medications Voices no new concerns Alert, no signs of acute distress.

## 2014-09-08 LAB — HIV ANTIBODY (ROUTINE TESTING W REFLEX): HIV 1&2 Ab, 4th Generation: NONREACTIVE

## 2014-09-12 ENCOUNTER — Telehealth: Payer: Self-pay | Admitting: *Deleted

## 2014-09-12 NOTE — Telephone Encounter (Signed)
-----   Message from Dessa Phi, MD sent at 09/08/2014  9:20 AM EDT ----- Screening HIV negative

## 2014-09-12 NOTE — Telephone Encounter (Signed)
Pt aware of results 

## 2015-01-04 ENCOUNTER — Encounter: Payer: Self-pay | Admitting: Family Medicine

## 2015-01-04 ENCOUNTER — Ambulatory Visit: Payer: Medicaid Other | Attending: Family Medicine | Admitting: Family Medicine

## 2015-01-04 VITALS — BP 116/80 | HR 68 | Temp 98.0°F | Resp 16 | Ht 66.0 in | Wt 134.0 lb

## 2015-01-04 DIAGNOSIS — F17210 Nicotine dependence, cigarettes, uncomplicated: Secondary | ICD-10-CM | POA: Insufficient documentation

## 2015-01-04 DIAGNOSIS — G4700 Insomnia, unspecified: Secondary | ICD-10-CM | POA: Diagnosis present

## 2015-01-04 DIAGNOSIS — Z23 Encounter for immunization: Secondary | ICD-10-CM | POA: Insufficient documentation

## 2015-01-04 DIAGNOSIS — G4726 Circadian rhythm sleep disorder, shift work type: Secondary | ICD-10-CM | POA: Diagnosis not present

## 2015-01-04 NOTE — Progress Notes (Signed)
C/C unable to sleep  No pain today  Hx tobacco 1ppday

## 2015-01-04 NOTE — Patient Instructions (Addendum)
You have be conditioned to sleep during the early morning into the afternoon. You have to re-condition your body and mind to sleep at night.   Melatonin or tryptophan are safe OTC natural sleep aids  You can also try benadryl 250 mg at night around 9 PM.   It will take work but choose a night where you wake up at 8 AM regardless of when you fell asleep, stay up all day then take sleep aid around 9 PM if needed. This will help train your body to sleep at night and to be awake in the morning and afternoon.   Sleep hygiene - When possible, the sleep environment should be designed to promote consolidated  sleep, with particular attention to light, temperature, and noise level.  Light-blocking shades should be used to reduce as much light as possible, with cool ambient temperature (generally between 60 to 5F).   If ambient noise cannot be controlled, a white noise machine may help reduce arousals that are due to variability in ambient noise.  F/u in 3 months for check of sleep   Dr. Armen Pickup

## 2015-01-04 NOTE — Assessment & Plan Note (Addendum)
A: Patient sleeps from 3 AM-12 PM for over one year. Not working or in school. No daily obligations.  P: Discussed sleep hygiene Information provided OTC sleep aid

## 2015-01-04 NOTE — Progress Notes (Signed)
Subjective:  Patient ID: William May, male    DOB: 09-20-1985  Age: 29 y.o. MRN: [id number]  CC: Insomnia   HPI William May presents for   1. Insomnia: for over one year. Not depressed. Feels anxious sometimes. Feels tired around 2:30-3:00 PM. Fall asleep around 3 AM wake up 12:30-1:00 PM. Feels well rested in the afternoon. During the afternoon/evening he relaxes throughout the day. Goes for a walk. Does not feel SOB when walking. Not working or in school. He has not worked for years but when he did work her worked 3rd shift. He lives alone. He reports he does not snore.   Social History  Substance Use Topics  . Smoking status: Current Every Day Smoker -- 0.20 packs/day for 7 years    Types: Cigarettes  . Smokeless tobacco: Not on file  . Alcohol Use: No    Outpatient Prescriptions Prior to Visit  Medication Sig Dispense Refill  . acetaminophen (TYLENOL) 325 MG tablet Take 2 tablets (650 mg total) by mouth every 6 (six) hours as needed. (Patient not taking: Reported on 09/07/2014)    . albuterol (PROVENTIL HFA;VENTOLIN HFA) 108 (90 BASE) MCG/ACT inhaler Inhale 2 puffs into the lungs every 4 (four) hours as needed for wheezing. 1 Inhaler 12  . budesonide-formoterol (SYMBICORT) 80-4.5 MCG/ACT inhaler Inhale 2 puffs into the lungs 2 (two) times daily. 1 Inhaler 12   No facility-administered medications prior to visit.    ROS Review of Systems  Constitutional: Negative for fever, chills, fatigue and unexpected weight change.  Eyes: Negative for visual disturbance.  Respiratory: Negative for cough and shortness of breath.   Cardiovascular: Negative for chest pain, palpitations and leg swelling.  Gastrointestinal: Negative for nausea, vomiting, abdominal pain, diarrhea, constipation and blood in stool.  Endocrine: Negative for polydipsia, polyphagia and polyuria.  Musculoskeletal: Negative for myalgias, back pain, arthralgias, gait problem and neck pain.  Skin: Negative  for rash.  Allergic/Immunologic: Negative for immunocompromised state.  Hematological: Negative for adenopathy. Does not bruise/bleed easily.  Psychiatric/Behavioral: Positive for sleep disturbance. Negative for suicidal ideas and dysphoric mood. The patient is not nervous/anxious.   GAD-7: score of 8. 1-1,3,4,7. 2-2,6.   Objective:  BP 116/80 mmHg  Pulse 68  Temp(Src) 98 F (36.7 C) (Oral)  Resp 16  Wt 134 lb (60.782 kg)  SpO2 97%  BP/Weight 01/04/2015 09/07/2014 04/23/2013  Systolic BP 116 131 114  Diastolic BP 80 83 75  Wt. (Lbs) 134 130 136.2  BMI 21.64 20.99 21.99   Physical Exam  Constitutional: He appears well-developed and well-nourished. No distress.  HENT:  Head: Normocephalic and atraumatic.  Nose: Mucosal edema present.  Neck: Normal range of motion. Neck supple.  Cardiovascular: Normal rate, regular rhythm, normal heart sounds and intact distal pulses.   Pulmonary/Chest: Effort normal and breath sounds normal.  Musculoskeletal: He exhibits no edema.  Neurological: He is alert.  Skin: Skin is warm and dry. No rash noted. No erythema.  Psychiatric: He has a normal mood and affect.     Assessment & Plan:   Problem List Items Addressed This Visit    Persistent circadian rhythm sleep disorder, shift work type - Primary (Chronic)    A: Patient sleeps from 3 AM-12 PM for over one year. Not working or in school. No daily obligations.  P: Discussed sleep hygiene Information provided OTC sleep aid        Relevant Orders   Flu Vaccine QUAD 36+ mos IM (Completed)  No orders of the defined types were placed in this encounter.    Follow-up: No Follow-up on file.   Boykin Nearing MD

## 2015-01-23 ENCOUNTER — Telehealth: Payer: Self-pay | Admitting: Clinical

## 2015-01-23 NOTE — Telephone Encounter (Signed)
Left HIPPA-compliant message to call back Carlethia Mesquita at [facility] at [phone] 

## 2015-12-30 ENCOUNTER — Other Ambulatory Visit: Payer: Self-pay | Admitting: Family Medicine

## 2015-12-30 DIAGNOSIS — J45909 Unspecified asthma, uncomplicated: Secondary | ICD-10-CM

## 2016-03-10 ENCOUNTER — Ambulatory Visit: Payer: Medicaid Other | Attending: Family Medicine | Admitting: Internal Medicine

## 2016-03-10 VITALS — BP 130/86 | HR 97 | Resp 28

## 2016-03-10 DIAGNOSIS — Z720 Tobacco use: Secondary | ICD-10-CM

## 2016-03-10 DIAGNOSIS — J4541 Moderate persistent asthma with (acute) exacerbation: Secondary | ICD-10-CM

## 2016-03-10 DIAGNOSIS — J45909 Unspecified asthma, uncomplicated: Secondary | ICD-10-CM

## 2016-03-10 MED ORDER — PREDNISONE 20 MG PO TABS: 20.0000 mg | ORAL_TABLET | Freq: Every day | ORAL | 0 refills | Status: DC

## 2016-03-10 MED ORDER — ALBUTEROL SULFATE HFA 108 (90 BASE) MCG/ACT IN AERS: 2.0000 | INHALATION_SPRAY | RESPIRATORY_TRACT | 3 refills | Status: DC | PRN

## 2016-03-10 MED ORDER — BUDESONIDE-FORMOTEROL FUMARATE 80-4.5 MCG/ACT IN AERO: 2.0000 | INHALATION_SPRAY | Freq: Two times a day (BID) | RESPIRATORY_TRACT | 3 refills | Status: DC

## 2016-03-10 MED ORDER — METHYLPREDNISOLONE SODIUM SUCC 125 MG IJ SOLR
125.0000 mg | Freq: Once | INTRAMUSCULAR | Status: AC
  Administered 2016-03-10: 125 mg via INTRAMUSCULAR

## 2016-03-10 MED ORDER — ALBUTEROL SULFATE (2.5 MG/3ML) 0.083% IN NEBU
2.5000 mg | INHALATION_SOLUTION | Freq: Once | RESPIRATORY_TRACT | Status: AC
  Administered 2016-03-10: 2.5 mg via RESPIRATORY_TRACT

## 2016-03-10 MED FILL — PROAIR HFA 90 MCG INHALER: 108 (90 BAS | 25 days supply | Qty: 9 | Fill #0

## 2016-03-10 MED FILL — SYMBICORT 80-4.5 MCG INH: 80-4.5 | 30 days supply | Qty: 10 | Fill #0

## 2016-03-10 MED FILL — predniSONE 20 MG TABS: 20 | 5 days supply | Qty: 5 | Fill #0

## 2016-03-10 NOTE — Progress Notes (Signed)
William ShorterMichael May, is a 30 y.o. male  [id number]  [id number]  DOB - [birth year]  No chief complaint on file.      Subjective:   William ShorterMichael May is a 30 y.o. male here today as a walk-in. Patient has been lost to follow-up for over a year, walking into the clinic today with shortness of breath in apparent acute asthma attack. Patient had broken speech, saturating 89% on room air, with repeated cough. Patient said he is out of all the inhalers for months, and he continues to smoke cigarette. He was accompanied to the clinic today by his mom. He woke up this morning with shortness of breath and wheezing, he is so short of breath that he thinks if he goes to the ED, having to wait before seen a doctor or before any intervention may be dangerous for him, so he came to the clinic. He denies any fever. He denies any inciting factor. Patient has No headache, No chest pain, No abdominal pain - No Nausea, No new weakness tingling or numbness.  No problems updated.  ALLERGIES: No Known Allergies  PAST MEDICAL HISTORY: Past Medical History:  Diagnosis Date  . Asthma     MEDICATIONS AT HOME: Prior to Admission medications   Medication Sig Start Date End Date Taking? Authorizing Provider  albuterol (PROVENTIL HFA) 108 (90 Base) MCG/ACT inhaler Inhale 2 puffs into the lungs every 4 (four) hours as needed for wheezing or shortness of breath. 03/10/16   Quentin Angstlugbemiga E Baer Hinton, MD  budesonide-formoterol (SYMBICORT) 80-4.5 MCG/ACT inhaler Inhale 2 puffs into the lungs 2 (two) times daily. 03/10/16   Quentin Angstlugbemiga E Quintina Hakeem, MD  predniSONE (DELTASONE) 20 MG tablet Take 1 tablet (20 mg total) by mouth daily with breakfast. 03/10/16   Quentin Angstlugbemiga E Rashad Auld, MD    Objective:  There were no vitals filed for this visit. Exam General appearance : Awake, alert, in mild to moderate respiratory distress, using accessory resp muscles plus flaring of the ala nasi. Speech Clear but broken.  HEENT: Atraumatic  and Normocephalic, pupils equally reactive to light and accomodation Neck: Supple, no JVD. No cervical lymphadenopathy.  Chest: Reduced entry bilaterally, with wheezes, no crepitations. CVS: S1 S2 regular, tachycardia, no murmurs.  Abdomen: Bowel sounds present, Non tender and not distended with no gaurding, rigidity or rebound. Extremities: B/L Lower Ext shows no edema, both legs are warm to touch Neurology: Awake alert, and oriented X 3, CN II-XII intact, Non focal Skin: No Rash  Data Review No results found for: HGBA1C  Assessment & Plan   1. Moderate persistent asthma with acute exacerbation  - Patient was immediately groomed, started on albuterol nebulization, was given IM Solu-Medrol 125 mg stat - Patient brought to better after the first nebulizer treatment, saturating 93% on room air, her lungs became clear with minimal wheezes. - budesonide-formoterol (SYMBICORT) 80-4.5 MCG/ACT inhaler; Inhale 2 puffs into the lungs 2 (two) times daily.  Dispense: 1 Inhaler; Refill: 3 - albuterol (PROVENTIL HFA) 108 (90 Base) MCG/ACT inhaler; Inhale 2 puffs into the lungs every 4 (four) hours as needed for wheezing or shortness of breath.  Dispense: 1 Inhaler; Refill: 3 - predniSONE (DELTASONE) 20 MG tablet; Take 1 tablet (20 mg total) by mouth daily with breakfast.  Dispense: 5 tablet; Refill: 0  2. Tobacco abuse  William NeedleMichael was counseled on the dangers of tobacco use, and was advised to quit. Reviewed strategies to maximize success, including removing cigarettes and smoking materials from environment, stress management and support of  family/friends.   3. Asthma, chronic, unspecified asthma severity, uncomplicated  - budesonide-formoterol (SYMBICORT) 80-4.5 MCG/ACT inhaler; Inhale 2 puffs into the lungs 2 (two) times daily.  Dispense: 1 Inhaler; Refill: 3 - albuterol (PROVENTIL HFA) 108 (90 Base) MCG/ACT inhaler; Inhale 2 puffs into the lungs every 4 (four) hours as needed for wheezing or  shortness of breath.  Dispense: 1 Inhaler; Refill: 3   Patient have been counseled extensively about nutrition and exercise. Other issues discussed during this visit include: low cholesterol diet, weight control and daily exercise, and importance of adherence with medications and regular follow-up.   Return in about 4 weeks (around 04/07/2016) for Asthma.  The patient was given clear instructions to go to ER or return to medical center if symptoms don't improve, worsen or new problems develop. The patient verbalized understanding. The patient was told to call to get lab results if they haven't heard anything in the next week.   This note has been created with Education officer, environmentalDragon speech recognition software and smart phrase technology. Any transcriptional errors are unintentional.    Jeanann LewandowskyJEGEDE, Marckus Hanover, MD, MHA, FACP, FAAP, CPE [facility] [place], [place] [phone]   03/10/2016, 2:02 PM

## 2016-03-10 NOTE — Patient Instructions (Signed)
Steps to Quit Smoking Smoking tobacco can be bad for your health. It can also affect almost every organ in your body. Smoking puts you and people around you at risk for many serious long-lasting (chronic) diseases. Quitting smoking is hard, but it is one of the best things that you can do for your health. It is never too late to quit. What are the benefits of quitting smoking? When you quit smoking, you lower your risk for getting serious diseases and conditions. They can include:  Lung cancer or lung disease.  Heart disease.  Stroke.  Heart attack.  Not being able to have children (infertility).  Weak bones (osteoporosis) and broken bones (fractures). If you have coughing, wheezing, and shortness of breath, those symptoms may get better when you quit. You may also get sick less often. If you are pregnant, quitting smoking can help to lower your chances of having a baby of low birth weight. What can I do to help me quit smoking? Talk with your doctor about what can help you quit smoking. Some things you can do (strategies) include:  Quitting smoking totally, instead of slowly cutting back how much you smoke over a period of time.  Going to in-person counseling. You are more likely to quit if you go to many counseling sessions.  Using resources and support systems, such as:  Online chats with a counselor.  Phone quitlines.  Printed self-help materials.  Support groups or group counseling.  Text messaging programs.  Mobile phone apps or applications.  Taking medicines. Some of these medicines may have nicotine in them. If you are pregnant or breastfeeding, do not take any medicines to quit smoking unless your doctor says it is okay. Talk with your doctor about counseling or other things that can help you. Talk with your doctor about using more than one strategy at the same time, such as taking medicines while you are also going to in-person counseling. This can help make quitting  easier. What things can I do to make it easier to quit? Quitting smoking might feel very hard at first, but there is a lot that you can do to make it easier. Take these steps:  Talk to your family and friends. Ask them to support and encourage you.  Call phone quitlines, reach out to support groups, or work with a counselor.  Ask people who smoke to not smoke around you.  Avoid places that make you want (trigger) to smoke, such as:  Bars.  Parties.  Smoke-break areas at work.  Spend time with people who do not smoke.  Lower the stress in your life. Stress can make you want to smoke. Try these things to help your stress:  Getting regular exercise.  Deep-breathing exercises.  Yoga.  Meditating.  Doing a body scan. To do this, close your eyes, focus on one area of your body at a time from head to toe, and notice which parts of your body are tense. Try to relax the muscles in those areas.  Download or buy apps on your mobile phone or tablet that can help you stick to your quit plan. There are many free apps, such as QuitGuide from the CDC (Centers for Disease Control and Prevention). You can find more support from smokefree.gov and other websites. This information is not intended to replace advice given to you by your health care provider. Make sure you discuss any questions you have with your health care provider. Document Released: 01/11/2009 Document Revised: 11/13/2015 Document   Reviewed: 08/01/2014 Elsevier Interactive Patient Education  2017 Elsevier Inc. Asthma, Acute Bronchospasm Acute bronchospasm caused by asthma is also referred to as an asthma attack. Bronchospasm means your air passages become narrowed. The narrowing is caused by inflammation and tightening of the muscles in the air tubes (bronchi) in your lungs. This can make it hard to breathe or cause you to wheeze and cough. What are the causes? Possible triggers are:  Animal dander from the skin, hair, or  feathers of animals.  Dust mites contained in house dust.  Cockroaches.  Pollen from trees or grass.  Mold.  Cigarette or tobacco smoke.  Air pollutants such as dust, household cleaners, hair sprays, aerosol sprays, paint fumes, strong chemicals, or strong odors.  Cold air or weather changes. Cold air may trigger inflammation. Winds increase molds and pollens in the air.  Strong emotions such as crying or laughing hard.  Stress.  Certain medicines such as aspirin or beta-blockers.  Sulfites in foods and drinks, such as dried fruits and wine.  Infections or inflammatory conditions, such as a flu, cold, or inflammation of the nasal membranes (rhinitis).  Gastroesophageal reflux disease (GERD). GERD is a condition where stomach acid backs up into your esophagus.  Exercise or strenuous activity. What are the signs or symptoms?  Wheezing.  Excessive coughing, particularly at night.  Chest tightness.  Shortness of breath. How is this diagnosed? Your health care provider will ask you about your medical history and perform a physical exam. A chest X-ray or blood testing may be performed to look for other causes of your symptoms or other conditions that may have triggered your asthma attack. How is this treated? Treatment is aimed at reducing inflammation and opening up the airways in your lungs. Most asthma attacks are treated with inhaled medicines. These include quick relief or rescue medicines (such as bronchodilators) and controller medicines (such as inhaled corticosteroids). These medicines are sometimes given through an inhaler or a nebulizer. Systemic steroid medicine taken by mouth or given through an IV tube also can be used to reduce the inflammation when an attack is moderate or severe. Antibiotic medicines are only used if a bacterial infection is present. Follow these instructions at home:  Rest.  Drink plenty of liquids. This helps the mucus to remain thin and be  easily coughed up. Only use caffeine in moderation and do not use alcohol until you have recovered from your illness.  Do not smoke. Avoid being exposed to secondhand smoke.  You play a critical role in keeping yourself in good health. Avoid exposure to things that cause you to wheeze or to have breathing problems.  Keep your medicines up-to-date and available. Carefully follow your health care provider's treatment plan.  Take your medicine exactly as prescribed.  When pollen or pollution is bad, keep windows closed and use an air conditioner or go to places with air conditioning.  Asthma requires careful medical care. See your health care provider for a follow-up as advised. If you are more than [redacted] weeks pregnant and you were prescribed any new medicines, let your obstetrician know about the visit and how you are doing. Follow up with your health care provider as directed.  After you have recovered from your asthma attack, make an appointment with your outpatient doctor to talk about ways to reduce the likelihood of future attacks. If you do not have a doctor who manages your asthma, make an appointment with a primary care doctor to discuss your asthma. Get  help right away if:  You are getting worse.  You have trouble breathing. If severe, call your local emergency services (911 in the [place]  You develop chest pain or discomfort.  You are vomiting.  You are not able to keep fluids down.  You are coughing up yellow, green, brown, or bloody sputum.  You have a fever and your symptoms suddenly get worse.  You have trouble swallowing. This information is not intended to replace advice given to you by your health care provider. Make sure you discuss any questions you have with your health care provider. Document Released: 07/02/2006 Document Revised: 08/29/2015 Document Reviewed: 09/22/2012 Elsevier Interactive Patient Education  2017 Elsevier Inc. Asthma, Adult Asthma is a  recurring condition in which the airways tighten and narrow. Asthma can make it difficult to breathe. It can cause coughing, wheezing, and shortness of breath. Asthma episodes, also called asthma attacks, range from minor to life-threatening. Asthma cannot be cured, but medicines and lifestyle changes can help control it. What are the causes? Asthma is believed to be caused by inherited (genetic) and environmental factors, but its exact cause is unknown. Asthma may be triggered by allergens, lung infections, or irritants in the air. Asthma triggers are different for each person. Common triggers include:  Animal dander.  Dust mites.  Cockroaches.  Pollen from trees or grass.  Mold.  Smoke.  Air pollutants such as dust, household cleaners, hair sprays, aerosol sprays, paint fumes, strong chemicals, or strong odors.  Cold air, weather changes, and winds (which increase molds and pollens in the air).  Strong emotional expressions such as crying or laughing hard.  Stress.  Certain medicines (such as aspirin) or types of drugs (such as beta-blockers).  Sulfites in foods and drinks. Foods and drinks that may contain sulfites include dried fruit, potato chips, and sparkling grape juice.  Infections or inflammatory conditions such as the flu, a cold, or an inflammation of the nasal membranes (rhinitis).  Gastroesophageal reflux disease (GERD).  Exercise or strenuous activity. What are the signs or symptoms? Symptoms may occur immediately after asthma is triggered or many hours later. Symptoms include:  Wheezing.  Excessive nighttime or early morning coughing.  Frequent or severe coughing with a common cold.  Chest tightness.  Shortness of breath. How is this diagnosed? The diagnosis of asthma is made by a review of your medical history and a physical exam. Tests may also be performed. These may include:  Lung function studies. These tests show how much air you breathe in and  out.  Allergy tests.  Imaging tests such as X-rays. How is this treated? Asthma cannot be cured, but it can usually be controlled. Treatment involves identifying and avoiding your asthma triggers. It also involves medicines. There are 2 classes of medicine used for asthma treatment:  Controller medicines. These prevent asthma symptoms from occurring. They are usually taken every day.  Reliever or rescue medicines. These quickly relieve asthma symptoms. They are used as needed and provide short-term relief. Your health care provider will help you create an asthma action plan. An asthma action plan is a written plan for managing and treating your asthma attacks. It includes a list of your asthma triggers and how they may be avoided. It also includes information on when medicines should be taken and when their dosage should be changed. An action plan may also involve the use of a device called a peak flow meter. A peak flow meter measures how well the lungs are working.  It helps you monitor your condition. Follow these instructions at home:  Take medicines only as directed by your health care provider. Speak with your health care provider if you have questions about how or when to take the medicines.  Use a peak flow meter as directed by your health care provider. Record and keep track of readings.  Understand and use the action plan to help minimize or stop an asthma attack without needing to seek medical care.  Control your home environment in the following ways to help prevent asthma attacks:  Do not smoke. Avoid being exposed to secondhand smoke.  Change your heating and air conditioning filter regularly.  Limit your use of fireplaces and wood stoves.  Get rid of pests (such as roaches and mice) and their droppings.  Throw away plants if you see mold on them.  Clean your floors and dust regularly. Use unscented cleaning products.  Try to have someone else vacuum for you regularly.  Stay out of rooms while they are being vacuumed and for a short while afterward. If you vacuum, use a dust mask from a hardware store, a double-layered or microfilter vacuum cleaner bag, or a vacuum cleaner with a HEPA filter.  Replace carpet with wood, tile, or vinyl flooring. Carpet can trap dander and dust.  Use allergy-proof pillows, mattress covers, and box spring covers.  Wash bed sheets and blankets every week in hot water and dry them in a dryer.  Use blankets that are made of polyester or cotton.  Clean bathrooms and kitchens with bleach. If possible, have someone repaint the walls in these rooms with mold-resistant paint. Keep out of the rooms that are being cleaned and painted.  Wash hands frequently. Contact a health care provider if:  You have wheezing, shortness of breath, or a cough even if taking medicine to prevent attacks.  The colored mucus you cough up (sputum) is thicker than usual.  Your sputum changes from clear or white to yellow, green, gray, or bloody.  You have any problems that may be related to the medicines you are taking (such as a rash, itching, swelling, or trouble breathing).  You are using a reliever medicine more than 2-3 times per week.  Your peak flow is still at 50-79% of your personal best after following your action plan for 1 hour.  You have a fever. Get help right away if:  You seem to be getting worse and are unresponsive to treatment during an asthma attack.  You are short of breath even at rest.  You get short of breath when doing very little physical activity.  You have difficulty eating, drinking, or talking due to asthma symptoms.  You develop chest pain.  You develop a fast heartbeat.  You have a bluish color to your lips or fingernails.  You are light-headed, dizzy, or faint.  Your peak flow is less than 50% of your personal best. This information is not intended to replace advice given to you by your health care  provider. Make sure you discuss any questions you have with your health care provider. Document Released: 03/17/2005 Document Revised: 08/29/2015 Document Reviewed: 10/14/2012 Elsevier Interactive Patient Education  2017 [employer]

## 2016-04-02 ENCOUNTER — Encounter: Payer: Self-pay | Admitting: Internal Medicine

## 2016-04-02 ENCOUNTER — Ambulatory Visit: Payer: Medicaid Other | Attending: Internal Medicine | Admitting: Internal Medicine

## 2016-04-02 VITALS — BP 115/53 | HR 99 | Temp 97.9°F | Resp 18 | Ht 70.0 in | Wt 137.0 lb

## 2016-04-02 DIAGNOSIS — J4541 Moderate persistent asthma with (acute) exacerbation: Secondary | ICD-10-CM | POA: Diagnosis not present

## 2016-04-02 DIAGNOSIS — Z79899 Other long term (current) drug therapy: Secondary | ICD-10-CM | POA: Insufficient documentation

## 2016-04-02 DIAGNOSIS — Z720 Tobacco use: Secondary | ICD-10-CM | POA: Insufficient documentation

## 2016-04-02 DIAGNOSIS — J45909 Unspecified asthma, uncomplicated: Secondary | ICD-10-CM | POA: Diagnosis present

## 2016-04-02 MED ORDER — PREDNISONE 20 MG PO TABS: 20.0000 mg | ORAL_TABLET | Freq: Every day | ORAL | 0 refills | Status: DC

## 2016-04-02 NOTE — Progress Notes (Signed)
Patient is here for Asthma FU  Patient denies pain at this time.  Patient has used inhalers today. Patient has eaten today.  Patient would like his flu vaccine today.

## 2016-04-02 NOTE — Progress Notes (Addendum)
William May, is a 31 y.o. male  [id number]  [id number]  DOB - 1985/08/12  Chief Complaint  Patient presents with  . Asthma       Subjective:   William May is a 31 y.o. male with history of asthma here today for a follow up visit. Patient was recently seen here in our walk-in clinic for acute asthma attack, treated with solumedrol, albuterol nebulization and discharged on tapered prednisone. Patient has no new complaint today, no asthma exacerbation since last visit, now using his inhalers as appropriate, working on quitting cigarette smoking. Patient has No headache, No chest pain, No abdominal pain - No Nausea, No new weakness tingling or numbness, No Cough - SOB.  No problems updated.  ALLERGIES: No Known Allergies  PAST MEDICAL HISTORY: Past Medical History:  Diagnosis Date  . Asthma     MEDICATIONS AT HOME: Prior to Admission medications   Medication Sig Start Date End Date Taking? Authorizing Provider  albuterol (PROVENTIL HFA) 108 (90 Base) MCG/ACT inhaler Inhale 2 puffs into the lungs every 4 (four) hours as needed for wheezing or shortness of breath. 03/10/16  Yes Quentin Angstlugbemiga E Chiron Campione, MD  budesonide-formoterol (SYMBICORT) 80-4.5 MCG/ACT inhaler Inhale 2 puffs into the lungs 2 (two) times daily. 03/10/16  Yes Quentin Angstlugbemiga E Gentry Pilson, MD  predniSONE (DELTASONE) 20 MG tablet Take 1 tablet (20 mg total) by mouth daily with breakfast. 04/02/16   Quentin Angstlugbemiga E Destynee Stringfellow, MD    Objective:   Vitals:   04/02/16 1503  BP: (!) 115/53  Pulse: 99  Resp: 18  Temp: 97.9 F (36.6 C)  TempSrc: Oral  SpO2: 96%  Weight: 137 lb (62.1 kg)  Height: 5\' 10"  (1.778 m)   Exam General appearance : Awake, alert, not in any distress. Speech Clear. Not toxic looking HEENT: Atraumatic and Normocephalic, pupils equally reactive to light and accomodation Neck: Supple, no JVD. No cervical lymphadenopathy.  Chest: Good air entry bilaterally, no added sounds  CVS: S1 S2 regular,  no murmurs.  Abdomen: Bowel sounds present, Non tender and not distended with no gaurding, rigidity or rebound. Extremities: B/L Lower Ext shows no edema, both legs are warm to touch Neurology: Awake alert, and oriented X 3, CN II-XII intact, Non focal Skin: No Rash  Data Review No results found for: HGBA1C  Assessment & Plan   1. Moderate persistent asthma with acute exacerbation  - predniSONE (DELTASONE) 20 MG tablet; Take 1 tablet (20 mg total) by mouth daily with breakfast.  Dispense: 5 tablet; Refill: 0  - CBC with Differential/Platelet - COMPLETE METABOLIC PANEL WITH GFR - Lipid panel - TSH  2. Tobacco abuse  Casimiro NeedleMichael was counseled on the dangers of tobacco use, and was advised to quit. Reviewed strategies to maximize success, including removing cigarettes and smoking materials from environment, stress management and support of family/friends.  Patient have been counseled extensively about nutrition and exercise. Other issues discussed during this visit include: low cholesterol diet, weight control and daily exercise, importance of adherence with medications and regular follow-up.   Return in about 6 months (around 09/30/2016) for Asthma.  The patient was given clear instructions to go to ER or return to medical center if symptoms don't improve, worsen or new problems develop. The patient verbalized understanding. The patient was told to call to get lab results if they haven't heard anything in the next week.   This note has been created with Education officer, environmentalDragon speech recognition software and smart phrase technology. Any transcriptional errors are unintentional.  Jeanann Lewandowsky, MD, MHA, Maxwell Caul, CPE [facility] [place], [place] [phone]   04/02/2016, 3:25 PM

## 2016-04-02 NOTE — Patient Instructions (Signed)
Steps to Quit Smoking Smoking tobacco can be bad for your health. It can also affect almost every organ in your body. Smoking puts you and people around you at risk for many serious long-lasting (chronic) diseases. Quitting smoking is hard, but it is one of the best things that you can do for your health. It is never too late to quit. What are the benefits of quitting smoking? When you quit smoking, you lower your risk for getting serious diseases and conditions. They can include:  Lung cancer or lung disease.  Heart disease.  Stroke.  Heart attack.  Not being able to have children (infertility).  Weak bones (osteoporosis) and broken bones (fractures). If you have coughing, wheezing, and shortness of breath, those symptoms may get better when you quit. You may also get sick less often. If you are pregnant, quitting smoking can help to lower your chances of having a baby of low birth weight. What can I do to help me quit smoking? Talk with your doctor about what can help you quit smoking. Some things you can do (strategies) include:  Quitting smoking totally, instead of slowly cutting back how much you smoke over a period of time.  Going to in-person counseling. You are more likely to quit if you go to many counseling sessions.  Using resources and support systems, such as:  Online chats with a counselor.  Phone quitlines.  Printed self-help materials.  Support groups or group counseling.  Text messaging programs.  Mobile phone apps or applications.  Taking medicines. Some of these medicines may have nicotine in them. If you are pregnant or breastfeeding, do not take any medicines to quit smoking unless your doctor says it is okay. Talk with your doctor about counseling or other things that can help you. Talk with your doctor about using more than one strategy at the same time, such as taking medicines while you are also going to in-person counseling. This can help make quitting  easier. What things can I do to make it easier to quit? Quitting smoking might feel very hard at first, but there is a lot that you can do to make it easier. Take these steps:  Talk to your family and friends. Ask them to support and encourage you.  Call phone quitlines, reach out to support groups, or work with a counselor.  Ask people who smoke to not smoke around you.  Avoid places that make you want (trigger) to smoke, such as:  Bars.  Parties.  Smoke-break areas at work.  Spend time with people who do not smoke.  Lower the stress in your life. Stress can make you want to smoke. Try these things to help your stress:  Getting regular exercise.  Deep-breathing exercises.  Yoga.  Meditating.  Doing a body scan. To do this, close your eyes, focus on one area of your body at a time from head to toe, and notice which parts of your body are tense. Try to relax the muscles in those areas.  Download or buy apps on your mobile phone or tablet that can help you stick to your quit plan. There are many free apps, such as QuitGuide from the CDC (Centers for Disease Control and Prevention). You can find more support from smokefree.gov and other websites. This information is not intended to replace advice given to you by your health care provider. Make sure you discuss any questions you have with your health care provider. Document Released: 01/11/2009 Document Revised: 11/13/2015 Document   Reviewed: 08/01/2014 Elsevier Interactive Patient Education  2017 Elsevier Inc. Asthma, Adult Asthma is a recurring condition in which the airways tighten and narrow. Asthma can make it difficult to breathe. It can cause coughing, wheezing, and shortness of breath. Asthma episodes, also called asthma attacks, range from minor to life-threatening. Asthma cannot be cured, but medicines and lifestyle changes can help control it. What are the causes? Asthma is believed to be caused by inherited (genetic)  and environmental factors, but its exact cause is unknown. Asthma may be triggered by allergens, lung infections, or irritants in the air. Asthma triggers are different for each person. Common triggers include:  Animal dander.  Dust mites.  Cockroaches.  Pollen from trees or grass.  Mold.  Smoke.  Air pollutants such as dust, household cleaners, hair sprays, aerosol sprays, paint fumes, strong chemicals, or strong odors.  Cold air, weather changes, and winds (which increase molds and pollens in the air).  Strong emotional expressions such as crying or laughing hard.  Stress.  Certain medicines (such as aspirin) or types of drugs (such as beta-blockers).  Sulfites in foods and drinks. Foods and drinks that may contain sulfites include dried fruit, potato chips, and sparkling grape juice.  Infections or inflammatory conditions such as the flu, a cold, or an inflammation of the nasal membranes (rhinitis).  Gastroesophageal reflux disease (GERD).  Exercise or strenuous activity. What are the signs or symptoms? Symptoms may occur immediately after asthma is triggered or many hours later. Symptoms include:  Wheezing.  Excessive nighttime or early morning coughing.  Frequent or severe coughing with a common cold.  Chest tightness.  Shortness of breath. How is this diagnosed? The diagnosis of asthma is made by a review of your medical history and a physical exam. Tests may also be performed. These may include:  Lung function studies. These tests show how much air you breathe in and out.  Allergy tests.  Imaging tests such as X-rays. How is this treated? Asthma cannot be cured, but it can usually be controlled. Treatment involves identifying and avoiding your asthma triggers. It also involves medicines. There are 2 classes of medicine used for asthma treatment:  Controller medicines. These prevent asthma symptoms from occurring. They are usually taken every  day.  Reliever or rescue medicines. These quickly relieve asthma symptoms. They are used as needed and provide short-term relief. Your health care provider will help you create an asthma action plan. An asthma action plan is a written plan for managing and treating your asthma attacks. It includes a list of your asthma triggers and how they may be avoided. It also includes information on when medicines should be taken and when their dosage should be changed. An action plan may also involve the use of a device called a peak flow meter. A peak flow meter measures how well the lungs are working. It helps you monitor your condition. Follow these instructions at home:  Take medicines only as directed by your health care provider. Speak with your health care provider if you have questions about how or when to take the medicines.  Use a peak flow meter as directed by your health care provider. Record and keep track of readings.  Understand and use the action plan to help minimize or stop an asthma attack without needing to seek medical care.  Control your home environment in the following ways to help prevent asthma attacks:  Do not smoke. Avoid being exposed to secondhand smoke.  Change your heating  and air conditioning filter regularly.  Limit your use of fireplaces and wood stoves.  Get rid of pests (such as roaches and mice) and their droppings.  Throw away plants if you see mold on them.  Clean your floors and dust regularly. Use unscented cleaning products.  Try to have someone else vacuum for you regularly. Stay out of rooms while they are being vacuumed and for a short while afterward. If you vacuum, use a dust mask from a hardware store, a double-layered or microfilter vacuum cleaner bag, or a vacuum cleaner with a HEPA filter.  Replace carpet with wood, tile, or vinyl flooring. Carpet can trap dander and dust.  Use allergy-proof pillows, mattress covers, and box spring  covers.  Wash bed sheets and blankets every week in hot water and dry them in a dryer.  Use blankets that are made of polyester or cotton.  Clean bathrooms and kitchens with bleach. If possible, have someone repaint the walls in these rooms with mold-resistant paint. Keep out of the rooms that are being cleaned and painted.  Wash hands frequently. Contact a health care provider if:  You have wheezing, shortness of breath, or a cough even if taking medicine to prevent attacks.  The colored mucus you cough up (sputum) is thicker than usual.  Your sputum changes from clear or white to yellow, green, gray, or bloody.  You have any problems that may be related to the medicines you are taking (such as a rash, itching, swelling, or trouble breathing).  You are using a reliever medicine more than 2-3 times per week.  Your peak flow is still at 50-79% of your personal best after following your action plan for 1 hour.  You have a fever. Get help right away if:  You seem to be getting worse and are unresponsive to treatment during an asthma attack.  You are short of breath even at rest.  You get short of breath when doing very little physical activity.  You have difficulty eating, drinking, or talking due to asthma symptoms.  You develop chest pain.  You develop a fast heartbeat.  You have a bluish color to your lips or fingernails.  You are light-headed, dizzy, or faint.  Your peak flow is less than 50% of your personal best. This information is not intended to replace advice given to you by your health care provider. Make sure you discuss any questions you have with your health care provider. Document Released: 03/17/2005 Document Revised: 08/29/2015 Document Reviewed: 10/14/2012 Elsevier Interactive Patient Education  2017 [employer]

## 2016-05-15 MED FILL — PROAIR HFA 90 MCG INHALER: 108 (90 BAS | 25 days supply | Qty: 9 | Fill #1

## 2016-05-15 MED FILL — SYMBICORT 80-4.5 MCG INH: 80-4.5 | 30 days supply | Qty: 10 | Fill #1

## 2016-07-15 MED FILL — SYMBICORT 80-4.5 MCG INH: 80-4.5 | 30 days supply | Qty: 10 | Fill #2

## 2016-10-08 ENCOUNTER — Ambulatory Visit: Payer: Medicaid Other | Attending: Internal Medicine | Admitting: Internal Medicine

## 2016-10-08 ENCOUNTER — Ambulatory Visit: Payer: Medicaid Other | Admitting: Internal Medicine

## 2016-10-08 ENCOUNTER — Encounter: Payer: Self-pay | Admitting: Internal Medicine

## 2016-10-08 VITALS — BP 118/78 | HR 73 | Temp 98.2°F | Resp 18 | Ht 69.0 in | Wt 130.4 lb

## 2016-10-08 DIAGNOSIS — Z720 Tobacco use: Secondary | ICD-10-CM | POA: Diagnosis not present

## 2016-10-08 DIAGNOSIS — Z7951 Long term (current) use of inhaled steroids: Secondary | ICD-10-CM | POA: Insufficient documentation

## 2016-10-08 DIAGNOSIS — J4541 Moderate persistent asthma with (acute) exacerbation: Secondary | ICD-10-CM | POA: Diagnosis not present

## 2016-10-08 DIAGNOSIS — J45909 Unspecified asthma, uncomplicated: Secondary | ICD-10-CM

## 2016-10-08 DIAGNOSIS — Z79899 Other long term (current) drug therapy: Secondary | ICD-10-CM | POA: Insufficient documentation

## 2016-10-08 MED ORDER — PREDNISONE 20 MG PO TABS: 20.0000 mg | ORAL_TABLET | Freq: Every day | ORAL | 0 refills | Status: DC

## 2016-10-08 MED ORDER — BUDESONIDE-FORMOTEROL FUMARATE 80-4.5 MCG/ACT IN AERO: 2.0000 | INHALATION_SPRAY | Freq: Two times a day (BID) | RESPIRATORY_TRACT | 3 refills | Status: DC

## 2016-10-08 MED ORDER — ALBUTEROL SULFATE HFA 108 (90 BASE) MCG/ACT IN AERS: 2.0000 | INHALATION_SPRAY | RESPIRATORY_TRACT | 3 refills | Status: DC | PRN

## 2016-10-08 NOTE — Progress Notes (Signed)
William May, is a 31 y.o. male  [id number]:[id number]  [id number]  DOB - [birth year]  Chief Complaint  Patient presents with  . Asthma      Subjective:   William May is a 31 y.o. male with history of asthma here today for a routine follow up visit. He has no complaint today. He continues to smoke heavily but he plans to quit by his next birthday. He is adherent with his inhales, he needs refill today. He still wheezes especially at nights but denies any daytime SOB. Patient has No headache, No chest pain, No abdominal pain - No Nausea, No new weakness tingling or numbness.  ALLERGIES: No Known Allergies  PAST MEDICAL HISTORY: Past Medical History:  Diagnosis Date  . Asthma     MEDICATIONS AT HOME: Prior to Admission medications   Medication Sig Start Date End Date Taking? Authorizing Provider  albuterol (PROVENTIL HFA) 108 (90 Base) MCG/ACT inhaler Inhale 2 puffs into the lungs every 4 (four) hours as needed for wheezing or shortness of breath. 10/08/16   Tresa Garter, MD  budesonide-formoterol (SYMBICORT) 80-4.5 MCG/ACT inhaler Inhale 2 puffs into the lungs 2 (two) times daily. 10/08/16   Tresa Garter, MD  predniSONE (DELTASONE) 20 MG tablet Take 1 tablet (20 mg total) by mouth daily with breakfast. 10/08/16   Tresa Garter, MD    Objective:   Vitals:   10/08/16 1532  BP: 118/78  Pulse: 73  Resp: 18  Temp: 98.2 F (36.8 C)  TempSrc: Oral  SpO2: 95%  Weight: 130 lb 6.4 oz (59.1 kg)  Height: '5\' 9"'$  (1.753 m)   Exam General appearance : Awake, alert, not in any distress. Speech Clear. Not toxic looking HEENT: Atraumatic and Normocephalic, pupils equally reactive to light and accomodation Neck: Supple, no JVD. No cervical lymphadenopathy.  Chest: Good air entry bilaterally, Wheezes ++ bilaterally, no crepitation  CVS: S1 S2 regular, no murmurs.  Abdomen: Bowel sounds present, Non tender and not distended with no gaurding, rigidity or  rebound. Extremities: B/L Lower Ext shows no edema, both legs are warm to touch Neurology: Awake alert, and oriented X 3, CN II-XII intact, Non focal Skin: No Rash  Data Review No results found for: HGBA1C  Assessment & Plan   1. Moderate persistent asthma with acute exacerbation  Refill - albuterol (PROVENTIL HFA) 108 (90 Base) MCG/ACT inhaler; Inhale 2 puffs into the lungs every 4 (four) hours as needed for wheezing or shortness of breath.  Dispense: 1 Inhaler; Refill: 3 - budesonide-formoterol (SYMBICORT) 80-4.5 MCG/ACT inhaler; Inhale 2 puffs into the lungs 2 (two) times daily.  Dispense: 1 Inhaler; Refill: 3 - predniSONE (DELTASONE) 20 MG tablet; Take 1 tablet (20 mg total) by mouth daily with breakfast.  Dispense: 5 tablet; Refill: 0  - CMP14+EGFR - Lipid panel - TSH  2. Tobacco abuse  William May was counseled on the dangers of tobacco use, and was advised to quit. Reviewed strategies to maximize success, including removing cigarettes and smoking materials from environment, stress management and support of family/friends.  Quit Date: Next Birthday: 02/12/2017  Patient have been counseled extensively about nutrition and exercise. Other issues discussed during this visit include: low cholesterol diet, weight control and daily exercise, importance of adherence with medications and regular follow-up.   Return in about 6 months (around 04/10/2017) for Asthma.  The patient was given clear instructions to go to ER or return to medical center if symptoms don't improve, worsen or new problems develop. The  patient verbalized understanding. The patient was told to call to get lab results if they haven't heard anything in the next week.   This note has been created with Surveyor, quantity. Any transcriptional errors are unintentional.    Angelica Chessman, MD, [place], Karilyn Cota, [place] and [place],  [place]   10/08/2016, 4:38 PM

## 2016-10-08 NOTE — Patient Instructions (Signed)

## 2016-10-09 ENCOUNTER — Ambulatory Visit: Payer: Medicaid Other | Attending: Internal Medicine

## 2016-10-09 DIAGNOSIS — Z0000 Encounter for general adult medical examination without abnormal findings: Secondary | ICD-10-CM | POA: Insufficient documentation

## 2016-10-09 DIAGNOSIS — J4540 Moderate persistent asthma, uncomplicated: Secondary | ICD-10-CM | POA: Diagnosis present

## 2016-10-09 NOTE — Progress Notes (Signed)
Patient here for lab visit  

## 2016-10-10 LAB — CMP14+EGFR
ALBUMIN: 4.5 g/dL (ref 3.5–5.5)
ALT: 14 IU/L (ref 0–44)
AST: 22 IU/L (ref 0–40)
Albumin/Globulin Ratio: 2.3 — ABNORMAL HIGH (ref 1.2–2.2)
Alkaline Phosphatase: 77 IU/L (ref 39–117)
BUN / CREAT RATIO: 16 (ref 9–20)
BUN: 13 mg/dL (ref 6–20)
Bilirubin Total: <0.2 mg/dL (ref 0.0–1.2)
CALCIUM: 9.4 mg/dL (ref 8.7–10.2)
CO2: 25 mmol/L (ref 20–29)
CREATININE: 0.8 mg/dL (ref 0.76–1.27)
Chloride: 104 mmol/L (ref 96–106)
GFR calc Af Amer: 139 mL/min/{1.73_m2} (ref >59)
GFR calc non Af Amer: 120 mL/min/{1.73_m2} (ref >59)
GLUCOSE: 73 mg/dL (ref 65–99)
Globulin, Total: 2 g/dL (ref 1.5–4.5)
Potassium: 4.3 mmol/L (ref 3.5–5.2)
Sodium: 143 mmol/L (ref 134–144)
TOTAL PROTEIN: 6.5 g/dL (ref 6.0–8.5)

## 2016-10-10 LAB — LIPID PANEL
Chol/HDL Ratio: 4.1 ratio (ref 0.0–5.0)
Cholesterol, Total: 124 mg/dL (ref 100–199)
HDL: 30 mg/dL — ABNORMAL LOW (ref >39)
LDL CALC: 35 mg/dL (ref 0–99)
Triglycerides: 297 mg/dL — ABNORMAL HIGH (ref 0–149)
VLDL CHOLESTEROL CAL: 59 mg/dL — AB (ref 5–40)

## 2016-10-10 LAB — TSH: TSH: 1.89 u[IU]/mL (ref 0.450–4.500)

## 2016-10-23 ENCOUNTER — Telehealth: Payer: Self-pay | Admitting: *Deleted

## 2016-10-23 NOTE — Telephone Encounter (Signed)
Patients mother states the patient completed a designated party release form MA informed mother of paperwork not being noted in the system and requested the patient complete another form when he returns to the office. !!!Patients lab results are normal!!!

## 2016-10-23 NOTE — Telephone Encounter (Signed)
-----   Message from Quentin Angstlugbemiga E Jegede, MD sent at 10/10/2016  4:15 PM EDT ----- Result is normal

## 2016-11-28 ENCOUNTER — Emergency Department (HOSPITAL_COMMUNITY): Payer: Medicaid Other

## 2016-11-28 ENCOUNTER — Emergency Department (HOSPITAL_COMMUNITY)
Admission: EM | Admit: 2016-11-28 | Discharge: 2016-11-28 | Disposition: A | Discharge status: Home / Self Care | Payer: Medicaid Other | Attending: Emergency Medicine | Admitting: Emergency Medicine

## 2016-11-28 ENCOUNTER — Encounter (HOSPITAL_COMMUNITY): Payer: Self-pay

## 2016-11-28 DIAGNOSIS — Y999 Unspecified external cause status: Secondary | ICD-10-CM | POA: Insufficient documentation

## 2016-11-28 DIAGNOSIS — J45909 Unspecified asthma, uncomplicated: Secondary | ICD-10-CM | POA: Insufficient documentation

## 2016-11-28 DIAGNOSIS — S0083XA Contusion of other part of head, initial encounter: Secondary | ICD-10-CM | POA: Diagnosis not present

## 2016-11-28 DIAGNOSIS — Y939 Activity, unspecified: Secondary | ICD-10-CM | POA: Diagnosis not present

## 2016-11-28 DIAGNOSIS — Y929 Unspecified place or not applicable: Secondary | ICD-10-CM | POA: Insufficient documentation

## 2016-11-28 DIAGNOSIS — Z79899 Other long term (current) drug therapy: Secondary | ICD-10-CM | POA: Insufficient documentation

## 2016-11-28 DIAGNOSIS — F17210 Nicotine dependence, cigarettes, uncomplicated: Secondary | ICD-10-CM | POA: Diagnosis not present

## 2016-11-28 DIAGNOSIS — S0990XA Unspecified injury of head, initial encounter: Secondary | ICD-10-CM | POA: Diagnosis present

## 2016-11-28 MED ORDER — CYCLOBENZAPRINE HCL 10 MG PO TABS: 10.0000 mg | ORAL_TABLET | Freq: Two times a day (BID) | ORAL | 0 refills | Status: DC | PRN

## 2016-11-28 MED ORDER — OXYCODONE-ACETAMINOPHEN 5-325 MG PO TABS
1.0000 | ORAL_TABLET | ORAL | Status: DC | PRN
  Administered 2016-11-28: 1 via ORAL

## 2016-11-28 MED ORDER — OXYCODONE-ACETAMINOPHEN 5-325 MG PO TABS
ORAL_TABLET | ORAL | Status: AC
  Filled 2016-11-28: qty 1

## 2016-11-28 MED ORDER — IBUPROFEN 600 MG PO TABS
600.0000 mg | ORAL_TABLET | Freq: Four times a day (QID) | ORAL | 0 refills | Status: DC | PRN
Start: 2016-11-28 — End: 2018-07-18

## 2016-11-28 NOTE — ED Notes (Signed)
PT states understanding of care given, follow up care, and medication prescribed. PT ambulated from ED to car with a steady gait. 
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
## 2016-11-28 NOTE — ED Notes (Signed)
Called pt x2 to update vitals, no response. 
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
## 2017-07-10 ENCOUNTER — Ambulatory Visit: Payer: Medicaid Other | Attending: Family Medicine | Admitting: Family Medicine

## 2017-07-10 ENCOUNTER — Encounter: Payer: Self-pay | Admitting: Family Medicine

## 2017-07-10 ENCOUNTER — Other Ambulatory Visit: Payer: Self-pay | Admitting: Family Medicine

## 2017-07-10 VITALS — BP 120/79 | HR 79 | Temp 97.7°F | Ht 69.0 in | Wt 138.0 lb

## 2017-07-10 DIAGNOSIS — Z720 Tobacco use: Secondary | ICD-10-CM

## 2017-07-10 DIAGNOSIS — J4520 Mild intermittent asthma, uncomplicated: Secondary | ICD-10-CM | POA: Diagnosis not present

## 2017-07-10 DIAGNOSIS — F17210 Nicotine dependence, cigarettes, uncomplicated: Secondary | ICD-10-CM | POA: Diagnosis not present

## 2017-07-10 DIAGNOSIS — J4541 Moderate persistent asthma with (acute) exacerbation: Secondary | ICD-10-CM

## 2017-07-10 DIAGNOSIS — Z79899 Other long term (current) drug therapy: Secondary | ICD-10-CM | POA: Insufficient documentation

## 2017-07-10 DIAGNOSIS — Z7951 Long term (current) use of inhaled steroids: Secondary | ICD-10-CM | POA: Insufficient documentation

## 2017-07-10 DIAGNOSIS — J45909 Unspecified asthma, uncomplicated: Secondary | ICD-10-CM | POA: Diagnosis present

## 2017-07-10 DIAGNOSIS — Z13228 Encounter for screening for other metabolic disorders: Secondary | ICD-10-CM | POA: Diagnosis not present

## 2017-07-10 MED ORDER — ALBUTEROL SULFATE (2.5 MG/3ML) 0.083% IN NEBU: 2.5000 mg | INHALATION_SOLUTION | Freq: Four times a day (QID) | RESPIRATORY_TRACT | 1 refills | Status: DC | PRN

## 2017-07-10 MED ORDER — BUDESONIDE-FORMOTEROL FUMARATE 80-4.5 MCG/ACT IN AERO: 2.0000 | INHALATION_SPRAY | Freq: Two times a day (BID) | RESPIRATORY_TRACT | 6 refills | Status: DC

## 2017-07-10 MED ORDER — ALBUTEROL SULFATE HFA 108 (90 BASE) MCG/ACT IN AERS: 2.0000 | INHALATION_SPRAY | RESPIRATORY_TRACT | 6 refills | Status: DC | PRN

## 2017-07-10 NOTE — Progress Notes (Signed)
Subjective:  Patient ID: William May, male    DOB: 1985-08-27  Age: 32 y.o. MRN: [id number]  CC: Establish Care and Asthma   HPI William May is a 32 year old male with a history of asthma who presents today to establish care with me as he was previously followed by Dr. Doreene Burke. He has been compliant with his Symbicort and Proventil and denies recent exacerbations of shortness of breath and uses his MDI about once a day. He is requesting a nebulizer machine to use in the event he has a flareup. He continues to smoke about 1-1/2 packs of cigarettes per week and when he is stressed increases it to 1 pack/day.  He is thinking of quitting but is not ready yet. Denies chest pain shortness of breath  Past Medical History:  Diagnosis Date  . Asthma     Past Surgical History:  Procedure Laterality Date  . WISDOM TOOTH EXTRACTION      No Known Allergies   Outpatient Medications Prior to Visit  Medication Sig Dispense Refill  . cyclobenzaprine (FLEXERIL) 10 MG tablet Take 1 tablet (10 mg total) by mouth 2 (two) times daily as needed for muscle spasms. 20 tablet 0  . albuterol (PROVENTIL HFA) 108 (90 Base) MCG/ACT inhaler Inhale 2 puffs into the lungs every 4 (four) hours as needed for wheezing or shortness of breath. 1 Inhaler 3  . budesonide-formoterol (SYMBICORT) 80-4.5 MCG/ACT inhaler Inhale 2 puffs into the lungs 2 (two) times daily. 1 Inhaler 3  . ibuprofen (ADVIL,MOTRIN) 600 MG tablet Take 1 tablet (600 mg total) by mouth every 6 (six) hours as needed. (Patient not taking: Reported on 07/10/2017) 30 tablet 0  . predniSONE (DELTASONE) 20 MG tablet Take 1 tablet (20 mg total) by mouth daily with breakfast. (Patient not taking: Reported on 07/10/2017) 5 tablet 0   No facility-administered medications prior to visit.     ROS Review of Systems  Constitutional: Negative for activity change and appetite change.  HENT: Negative for sinus pressure and sore throat.   Eyes:  Negative for visual disturbance.  Respiratory: Negative for cough, chest tightness and shortness of breath.   Cardiovascular: Negative for chest pain and leg swelling.  Gastrointestinal: Negative for abdominal distention, abdominal pain, constipation and diarrhea.  Endocrine: Negative.   Genitourinary: Negative for dysuria.  Musculoskeletal: Negative for joint swelling and myalgias.  Skin: Negative for rash.  Allergic/Immunologic: Negative.   Neurological: Negative for weakness, light-headedness and numbness.  Psychiatric/Behavioral: Negative for dysphoric mood and suicidal ideas.    Objective:  BP 120/79   Pulse 79   Temp 97.7 F (36.5 C) (Oral)   Ht '5\' 9"'$  (1.753 m)   Wt 138 lb (62.6 kg)   SpO2 96%   BMI 20.38 kg/m   BP/Weight 07/10/2017 11/28/2016 6/50/3546  Systolic BP 568 127 517  Diastolic BP 79 83 78  Wt. (Lbs) 138 133 130.4  BMI 20.38 19.64 19.26      Physical Exam  Constitutional: He is oriented to person, place, and time. He appears well-developed and well-nourished.  Cardiovascular: Normal rate, normal heart sounds and intact distal pulses.  No murmur heard. Pulmonary/Chest: Effort normal and breath sounds normal. He has no wheezes. He has no rales. He exhibits no tenderness.  Abdominal: Soft. Bowel sounds are normal. He exhibits no distension and no mass. There is no tenderness.  Musculoskeletal: Normal range of motion.  Neurological: He is alert and oriented to person, place, and time.  Skin: Skin is  warm and dry.    CMP Latest Ref Rng & Units 10/09/2016 10/23/2012  Glucose 65 - 99 mg/dL 73 113(H)  BUN 6 - 20 mg/dL 13 8  Creatinine 0.76 - 1.27 mg/dL 0.80 0.74  Sodium 134 - 144 mmol/L 143 136  Potassium 3.5 - 5.2 mmol/L 4.3 6.6(HH)  Chloride 96 - 106 mmol/L 104 101  CO2 20 - 29 mmol/L 25 24  Calcium 8.7 - 10.2 mg/dL 9.4 9.2  Total Protein 6.0 - 8.5 g/dL 6.5 -  Total Bilirubin 0.0 - 1.2 mg/dL <0.2 -  Alkaline Phos 39 - 117 IU/L 77 -  AST 0 - 40 IU/L 22 -    ALT 0 - 44 IU/L 14 -    Lipid Panel     Component Value Date/Time   CHOL 124 10/09/2016 0906   TRIG 297 (H) 10/09/2016 0906   HDL 30 (L) 10/09/2016 0906   CHOLHDL 4.1 10/09/2016 0906   LDLCALC 35 10/09/2016 0906    Assessment & Plan:   1. Moderate persistent asthma with acute exacerbation Stable Nebulizer machine provided in the clinic Educated on proper administration of medications - albuterol (PROVENTIL HFA) 108 (90 Base) MCG/ACT inhaler; Inhale 2 puffs into the lungs every 4 (four) hours as needed for wheezing or shortness of breath.  Dispense: 1 Inhaler; Refill: 6 - budesonide-formoterol (SYMBICORT) 80-4.5 MCG/ACT inhaler; Inhale 2 puffs into the lungs 2 (two) times daily.  Dispense: 1 Inhaler; Refill: 6  2. Tobacco abuse Spent 3 minutes counseling on cessation and he is not interested in nicotine replacement therapies and not yet ready to quit  3. Screening for metabolic disorder - KNL97+QBHA - Lipid panel   Meds ordered this encounter  Medications  . albuterol (PROVENTIL HFA) 108 (90 Base) MCG/ACT inhaler    Sig: Inhale 2 puffs into the lungs every 4 (four) hours as needed for wheezing or shortness of breath.    Dispense:  1 Inhaler    Refill:  6  . budesonide-formoterol (SYMBICORT) 80-4.5 MCG/ACT inhaler    Sig: Inhale 2 puffs into the lungs 2 (two) times daily.    Dispense:  1 Inhaler    Refill:  6    Follow-up: Return in about 6 months (around 01/09/2018) for Follow-up of asthma.   Charlott Rakes MD

## 2017-07-10 NOTE — Patient Instructions (Signed)
Steps to Quit Smoking Smoking tobacco can be bad for your health. It can also affect almost every organ in your body. Smoking puts you and people around you at risk for many serious long-lasting (chronic) diseases. Quitting smoking is hard, but it is one of the best things that you can do for your health. It is never too late to quit. What are the benefits of quitting smoking? When you quit smoking, you lower your risk for getting serious diseases and conditions. They can include:  Lung cancer or lung disease.  Heart disease.  Stroke.  Heart attack.  Not being able to have children (infertility).  Weak bones (osteoporosis) and broken bones (fractures).  If you have coughing, wheezing, and shortness of breath, those symptoms may get better when you quit. You may also get sick less often. If you are pregnant, quitting smoking can help to lower your chances of having a baby of low birth weight. What can I do to help me quit smoking? Talk with your doctor about what can help you quit smoking. Some things you can do (strategies) include:  Quitting smoking totally, instead of slowly cutting back how much you smoke over a period of time.  Going to in-person counseling. You are more likely to quit if you go to many counseling sessions.  Using resources and support systems, such as: ? Online chats with a counselor. ? Phone quitlines. ? Printed self-help materials. ? Support groups or group counseling. ? Text messaging programs. ? Mobile phone apps or applications.  Taking medicines. Some of these medicines may have nicotine in them. If you are pregnant or breastfeeding, do not take any medicines to quit smoking unless your doctor says it is okay. Talk with your doctor about counseling or other things that can help you.  Talk with your doctor about using more than one strategy at the same time, such as taking medicines while you are also going to in-person counseling. This can help make  quitting easier. What things can I do to make it easier to quit? Quitting smoking might feel very hard at first, but there is a lot that you can do to make it easier. Take these steps:  Talk to your family and friends. Ask them to support and encourage you.  Call phone quitlines, reach out to support groups, or work with a counselor.  Ask people who smoke to not smoke around you.  Avoid places that make you want (trigger) to smoke, such as: ? Bars. ? Parties. ? Smoke-break areas at work.  Spend time with people who do not smoke.  Lower the stress in your life. Stress can make you want to smoke. Try these things to help your stress: ? Getting regular exercise. ? Deep-breathing exercises. ? Yoga. ? Meditating. ? Doing a body scan. To do this, close your eyes, focus on one area of your body at a time from head to toe, and notice which parts of your body are tense. Try to relax the muscles in those areas.  Download or buy apps on your mobile phone or tablet that can help you stick to your quit plan. There are many free apps, such as QuitGuide from the CDC (Centers for Disease Control and Prevention). You can find more support from smokefree.gov and other websites.  This information is not intended to replace advice given to you by your health care provider. Make sure you discuss any questions you have with your health care provider. Document Released: 01/11/2009 Document   Revised: 11/13/2015 Document Reviewed: 08/01/2014 Elsevier Interactive Patient Education  2018 Elsevier Inc.  

## 2017-07-11 LAB — CMP14+EGFR
ALT: 12 IU/L (ref 0–44)
AST: 17 IU/L (ref 0–40)
Albumin/Globulin Ratio: 2.3 — ABNORMAL HIGH (ref 1.2–2.2)
Albumin: 4.6 g/dL (ref 3.5–5.5)
Alkaline Phosphatase: 65 IU/L (ref 39–117)
BUN/Creatinine Ratio: 12 (ref 9–20)
BUN: 9 mg/dL (ref 6–20)
Bilirubin Total: 0.6 mg/dL (ref 0.0–1.2)
CALCIUM: 9.6 mg/dL (ref 8.7–10.2)
CO2: 23 mmol/L (ref 20–29)
CREATININE: 0.75 mg/dL — AB (ref 0.76–1.27)
Chloride: 102 mmol/L (ref 96–106)
GFR, EST AFRICAN AMERICAN: 141 mL/min/{1.73_m2} (ref >59)
GFR, EST NON AFRICAN AMERICAN: 122 mL/min/{1.73_m2} (ref >59)
Globulin, Total: 2 g/dL (ref 1.5–4.5)
Glucose: 89 mg/dL (ref 65–99)
Potassium: 4.7 mmol/L (ref 3.5–5.2)
Sodium: 142 mmol/L (ref 134–144)
TOTAL PROTEIN: 6.6 g/dL (ref 6.0–8.5)

## 2017-07-11 LAB — LIPID PANEL
CHOL/HDL RATIO: 3.9 ratio (ref 0.0–5.0)
Cholesterol, Total: 136 mg/dL (ref 100–199)
HDL: 35 mg/dL — AB (ref >39)
LDL CALC: 84 mg/dL (ref 0–99)
TRIGLYCERIDES: 85 mg/dL (ref 0–149)
VLDL CHOLESTEROL CAL: 17 mg/dL (ref 5–40)

## 2017-07-16 ENCOUNTER — Telehealth: Payer: Self-pay

## 2017-07-16 NOTE — Telephone Encounter (Signed)
Patient was called and informed of lab results. 
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
## 2018-03-02 ENCOUNTER — Other Ambulatory Visit: Payer: Self-pay | Admitting: Family Medicine

## 2018-03-02 DIAGNOSIS — J4541 Moderate persistent asthma with (acute) exacerbation: Secondary | ICD-10-CM

## 2018-04-12 ENCOUNTER — Other Ambulatory Visit: Payer: Self-pay | Admitting: Family Medicine

## 2018-07-08 ENCOUNTER — Other Ambulatory Visit: Payer: Self-pay | Admitting: Family Medicine

## 2018-07-18 ENCOUNTER — Encounter (HOSPITAL_COMMUNITY): Payer: Self-pay

## 2018-07-18 ENCOUNTER — Other Ambulatory Visit: Payer: Self-pay

## 2018-07-18 ENCOUNTER — Ambulatory Visit (HOSPITAL_COMMUNITY)
Admission: EM | Admit: 2018-07-18 | Discharge: 2018-07-18 | Disposition: A | Discharge status: Home / Self Care | Payer: Medicaid Other | Attending: Family Medicine | Admitting: Family Medicine

## 2018-07-18 DIAGNOSIS — K029 Dental caries, unspecified: Secondary | ICD-10-CM

## 2018-07-18 MED ORDER — AMOXICILLIN 875 MG PO TABS: 875.0000 mg | ORAL_TABLET | Freq: Two times a day (BID) | ORAL | 0 refills | Status: DC

## 2018-07-18 MED ORDER — HYDROCODONE-ACETAMINOPHEN 5-325 MG PO TABS: 1.0000 | ORAL_TABLET | Freq: Four times a day (QID) | ORAL | 0 refills | Status: DC | PRN

## 2018-07-18 NOTE — ED Triage Notes (Signed)
Pt presents with dental pain on left side since Friday.

## 2018-07-18 NOTE — ED Provider Notes (Signed)
[facility]    CSN: [id number] Arrival date & time: 07/18/18  1058     History   Chief Complaint Chief Complaint  Patient presents with  . Dental Pain    HPI William May is a 33 y.o. male.   [facility] patient who presents with left upper dental pain x 2 days.  He has a large cavity in tooth #18     Past Medical History:  Diagnosis Date  . Asthma     Patient Active Problem List   Diagnosis Date Noted  . Persistent circadian rhythm sleep disorder, shift work type 01/04/2015  . Asthma, chronic 09/07/2014  . Healthcare maintenance 09/07/2014  . Tobacco abuse 10/23/2012    Past Surgical History:  Procedure Laterality Date  . WISDOM TOOTH EXTRACTION         Home Medications    Prior to Admission medications   Medication Sig Start Date End Date Taking? Authorizing Provider  albuterol (PROVENTIL HFA) 108 (90 Base) MCG/ACT inhaler Inhale 2 puffs into the lungs every 4 (four) hours as needed for wheezing or shortness of breath. 07/10/17   Hoy Register, MD  albuterol (PROVENTIL) (2.5 MG/3ML) 0.083% nebulizer solution SEE NOTES 07/09/18   Hoy Register, MD  amoxicillin (AMOXIL) 875 MG tablet Take 1 tablet (875 mg total) by mouth 2 (two) times daily. 07/18/18   Elvina Sidle, MD  budesonide-formoterol (SYMBICORT) 80-4.5 MCG/ACT inhaler Inhale 2 puffs into the lungs 2 (two) times daily. MUST MAKE APPT FOR FURTHER REFILLS 03/03/18   Hoy Register, MD  HYDROcodone-acetaminophen (NORCO) 5-325 MG tablet Take 1 tablet by mouth every 6 (six) hours as needed for moderate pain. 07/18/18   Elvina Sidle, MD    Family History History reviewed. No pertinent family history.  Social History Social History   Tobacco Use  . Smoking status: Current Every Day Smoker    Packs/day: 0.50    Years: 7.00    Pack years: 3.50    Types: Cigarettes  . Smokeless tobacco: Current User  Substance Use Topics  . Alcohol use: No  . Drug use: No     Allergies    Patient has no known allergies.   Review of Systems Review of Systems   Physical Exam Triage Vital Signs ED Triage Vitals [07/18/18 1112]  Enc Vitals Group     BP 131/78     Pulse Rate 87     Resp 18     Temp 97.6 F (36.4 C)     Temp Source Oral     SpO2 95 %     Weight      Height      Head Circumference      Peak Flow      Pain Score 10     Pain Loc      Pain Edu?      Excl. in GC?    No data found.  Updated Vital Signs BP 131/78 (BP Location: Left Arm)   Pulse 87   Temp 97.6 F (36.4 C) (Oral)   Resp 18   SpO2 95%    Physical Exam Vitals signs and nursing note reviewed.  Constitutional:      Appearance: Normal appearance. He is normal weight.  HENT:     Head: Normocephalic.     Right Ear: External ear normal.     Left Ear: External ear normal.     Nose: Nose normal.     Mouth/Throat:     Mouth: Mucous membranes  are moist.     Comments: Large dental carie in tooth #18 Eyes:     Conjunctiva/sclera: Conjunctivae normal.  Neck:     Musculoskeletal: Normal range of motion and neck supple.  Cardiovascular:     Rate and Rhythm: Normal rate.  Musculoskeletal: Normal range of motion.  Skin:    General: Skin is warm and dry.  Neurological:     General: No focal deficit present.     Mental Status: He is alert.  Psychiatric:        Mood and Affect: Mood normal.      UC Treatments / Results  Labs (all labs ordered are listed, but only abnormal results are displayed) Labs Reviewed - No data to display  EKG None  Radiology No results found.  Procedures Procedures (including critical care time)  Medications Ordered in UC Medications - No data to display  Initial Impression / Assessment and Plan / UC Course  I have reviewed the triage vital signs and the nursing notes.  Pertinent labs & imaging results that were available during my care of the patient were reviewed by me and considered in my medical decision making (see chart for details).     Final Clinical Impressions(s) / UC Diagnoses   Final diagnoses:  Dental caries     Discharge Instructions     Call Dr. Lawrence Marseillesivils for an appointment     ED Prescriptions    Medication Sig Dispense Auth. Provider   HYDROcodone-acetaminophen (NORCO) 5-325 MG tablet Take 1 tablet by mouth every 6 (six) hours as needed for moderate pain. 12 tablet Elvina SidleLauenstein, Keiosha Cancro, MD   amoxicillin (AMOXIL) 875 MG tablet Take 1 tablet (875 mg total) by mouth 2 (two) times daily. 20 tablet Elvina SidleLauenstein, Lonie Newsham, MD     Controlled Substance Prescriptions [place] Controlled Substance Registry consulted? Yes, I have consulted the  Controlled Substances Registry for this patient, and feel the risk/benefit ratio today is favorable for proceeding with this prescription for a controlled substance.   Elvina SidleLauenstein, Renise Gillies, MD 07/18/18 [phone]

## 2018-07-18 NOTE — Discharge Instructions (Addendum)
Call Dr. Lawrence Marseilles for an appointment

## 2018-10-25 ENCOUNTER — Other Ambulatory Visit: Payer: Self-pay | Admitting: Family Medicine

## 2018-10-25 DIAGNOSIS — J4541 Moderate persistent asthma with (acute) exacerbation: Secondary | ICD-10-CM

## 2018-11-26 ENCOUNTER — Other Ambulatory Visit: Payer: Self-pay | Admitting: Family Medicine

## 2018-11-26 DIAGNOSIS — J4541 Moderate persistent asthma with (acute) exacerbation: Secondary | ICD-10-CM

## 2019-01-04 ENCOUNTER — Encounter: Payer: Self-pay | Admitting: Nurse Practitioner

## 2019-01-04 ENCOUNTER — Other Ambulatory Visit: Payer: Self-pay

## 2019-01-04 ENCOUNTER — Ambulatory Visit: Payer: Medicaid Other | Attending: Nurse Practitioner | Admitting: Nurse Practitioner

## 2019-01-04 DIAGNOSIS — Z720 Tobacco use: Secondary | ICD-10-CM

## 2019-01-04 DIAGNOSIS — F17210 Nicotine dependence, cigarettes, uncomplicated: Secondary | ICD-10-CM | POA: Insufficient documentation

## 2019-01-04 DIAGNOSIS — J4541 Moderate persistent asthma with (acute) exacerbation: Secondary | ICD-10-CM | POA: Diagnosis not present

## 2019-01-04 MED ORDER — BUDESONIDE-FORMOTEROL FUMARATE 80-4.5 MCG/ACT IN AERO: 2.0000 | INHALATION_SPRAY | Freq: Two times a day (BID) | RESPIRATORY_TRACT | 6 refills | Status: DC

## 2019-01-04 MED ORDER — ALBUTEROL SULFATE HFA 108 (90 BASE) MCG/ACT IN AERS: 2.0000 | INHALATION_SPRAY | RESPIRATORY_TRACT | 0 refills | Status: DC | PRN

## 2019-01-04 MED ORDER — ALBUTEROL SULFATE (2.5 MG/3ML) 0.083% IN NEBU: INHALATION_SOLUTION | RESPIRATORY_TRACT | 3 refills | Status: DC

## 2019-01-04 NOTE — Progress Notes (Signed)
Virtual Visit via Telephone Note Due to national recommendations of social distancing due to COVID 19, telehealth visit is felt to be most appropriate for this patient at this time.  I discussed the limitations, risks, security and privacy concerns of performing an evaluation and management service by telephone and the availability of in person appointments. I also discussed with the patient that there may be a patient responsible charge related to this service. The patient expressed understanding and agreed to proceed.    I connected with William May on 01/04/19  at   3:30 PM EDT  EDT by telephone and verified that I am speaking with the correct person using two identifiers.   Consent I discussed the limitations, risks, security and privacy concerns of performing an evaluation and management service by telephone and the availability of in person appointments. I also discussed with the patient that there may be a patient responsible charge related to this service. The patient expressed understanding and agreed to proceed.   Location of Patient: Private Residence   Location of Provider: [facility]    Persons participating in Telemedicine visit: William Denver FNP-BC [place] CMA William May    History of Present Illness: Telemedicine visit for: Establishing Care  Diagnosed with asthma as a child. He is currently using his albuterol inhaler as an every day inhaler. Never picked up his symbicort that was filled in December by his PCP. We discussed the proper use of SABA and LABAs. He is aware he should not be using his albuterol inhaler or nebs on an every day basis. States he has not smoked cigarettes in 2 weeks. He has not had any ED or urgent care visits for asthma exacerbations in several years. Currently denies cough, wheezing. Does endorse shortness of breath requiring excessive use of albuterol.    Past Medical History:  Diagnosis Date  .  Asthma     Past Surgical History:  Procedure Laterality Date  . WISDOM TOOTH EXTRACTION      Family History  Problem Relation Age of Onset  . Hypertension Neg Hx     Social History   Socioeconomic History  . Marital status: Single    Spouse name: Not on file  . Number of children: Not on file  . Years of education: Not on file  . Highest education level: Not on file  Occupational History  . Not on file  Social Needs  . Financial resource strain: Not on file  . Food insecurity    Worry: Not on file    Inability: Not on file  . Transportation needs    Medical: Not on file    Non-medical: Not on file  Tobacco Use  . Smoking status: Current Every Day Smoker    Packs/day: 0.50    Years: 7.00    Pack years: 3.50    Types: Cigarettes  . Smokeless tobacco: Current User  Substance and Sexual Activity  . Alcohol use: No  . Drug use: No  . Sexual activity: Not Currently  Lifestyle  . Physical activity    Days per week: Not on file    Minutes per session: Not on file  . Stress: Not on file  Relationships  . Social Musician on phone: Not on file    Gets together: Not on file    Attends religious service: Not on file    Active member of club or organization: Not on file  Attends meetings of clubs or organizations: Not on file    Relationship status: Not on file  Other Topics Concern  . Not on file  Social History Narrative  . Not on file     Observations/Objective: Awake, alert and oriented x 3   Review of Systems  Constitutional: Negative for fever, malaise/fatigue and weight loss.  HENT: Negative.  Negative for nosebleeds.   Eyes: Negative.  Negative for blurred vision, double vision and photophobia.  Respiratory: Positive for shortness of breath. Negative for cough and wheezing.   Cardiovascular: Negative.  Negative for chest pain, palpitations and leg swelling.  Gastrointestinal: Negative.  Negative for heartburn, nausea and vomiting.   Musculoskeletal: Negative.  Negative for myalgias.  Neurological: Negative.  Negative for dizziness, focal weakness, seizures and headaches.  Psychiatric/Behavioral: Negative.  Negative for suicidal ideas.    Assessment and Plan: William May was seen today for establish care.  Diagnoses and all orders for this visit:  Moderate persistent asthma with acute exacerbation -     albuterol (PROVENTIL HFA) 108 (90 Base) MCG/ACT inhaler; Inhale 2 puffs into the lungs every 4 (four) hours as needed for wheezing or shortness of breath. DO NOT USE MORE THAN 2-3 times per week. -     albuterol (PROVENTIL) (2.5 MG/3ML) 0.083% nebulizer solution; INHALE CONTENTS OF 1 VIAL VIA NEBULIZER EVERY 6 HOURS AS NEEDED FOR WHEEZING OR SHORTNESS OF BREATH -     budesonide-formoterol (SYMBICORT) 80-4.5 MCG/ACT inhaler; Inhale 2 puffs into the lungs 2 (two) times daily.  Tobacco abuse Denies smoking for 2 weeks.      Follow Up Instructions Return in about 6 weeks (around 02/15/2019).     I discussed the assessment and treatment plan with the patient. The patient was provided an opportunity to ask questions and all were answered. The patient agreed with the plan and demonstrated an understanding of the instructions.   The patient was advised to call back or seek an in-person evaluation if the symptoms worsen or if the condition fails to improve as anticipated.  I provided 23 minutes of non-face-to-face time during this encounter including median intraservice time, reviewing previous notes, labs, imaging, medications and explaining diagnosis and management.  Gildardo Pounds, FNP-BC

## 2019-01-14 ENCOUNTER — Ambulatory Visit: Payer: Medicaid Other | Admitting: Pharmacist

## 2019-01-14 ENCOUNTER — Other Ambulatory Visit: Payer: Medicaid Other

## 2019-02-05 ENCOUNTER — Other Ambulatory Visit: Payer: Self-pay

## 2019-02-05 ENCOUNTER — Ambulatory Visit (HOSPITAL_COMMUNITY)
Admission: EM | Admit: 2019-02-05 | Discharge: 2019-02-05 | Disposition: A | Discharge status: Home / Self Care | Payer: Medicaid Other | Attending: Urgent Care | Admitting: Urgent Care

## 2019-02-05 ENCOUNTER — Encounter (HOSPITAL_COMMUNITY): Payer: Self-pay

## 2019-02-05 DIAGNOSIS — R0789 Other chest pain: Secondary | ICD-10-CM

## 2019-02-05 DIAGNOSIS — M940 Chondrocostal junction syndrome [Tietze]: Secondary | ICD-10-CM | POA: Diagnosis not present

## 2019-02-05 MED ORDER — CYCLOBENZAPRINE HCL 5 MG PO TABS: 5.0000 mg | ORAL_TABLET | Freq: Three times a day (TID) | ORAL | 0 refills | Status: DC | PRN

## 2019-02-05 MED ORDER — NAPROXEN 500 MG PO TABS: 500.0000 mg | ORAL_TABLET | Freq: Two times a day (BID) | ORAL | 0 refills | Status: DC

## 2019-02-05 NOTE — ED Triage Notes (Signed)
Pt states he is having a lump in the left side of his chest x 2 days. Pt states having chest pain x 2 days when he lift a heavy object.

## 2019-02-05 NOTE — ED Provider Notes (Signed)
MRN: [id number] DOB: 1985/06/27  Subjective:   William May is a 33 y.o. male presenting for 2-day history of acute onset persistent left-sided lump that is very tender.  Patient is worried about infection.  Denies any fever, trauma, falls, recent strenuous exercise, heavy lifting.  Denies runny stuffy nose, cough, shortness of breath, wheezing, nausea, vomiting, belly pain.  Has not tried any medications for relief.  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  No current facility-administered medications for this encounter.   Current Outpatient Medications:  .  albuterol (PROVENTIL HFA) 108 (90 Base) MCG/ACT inhaler, Inhale 2 puffs into the lungs every 4 (four) hours as needed for wheezing or shortness of breath. DO NOT USE MORE THAN 2-3 times per week., Disp: 6.7 g, Rfl: 0 .  albuterol (PROVENTIL) (2.5 MG/3ML) 0.083% nebulizer solution, INHALE CONTENTS OF 1 VIAL VIA NEBULIZER EVERY 6 HOURS AS NEEDED FOR WHEEZING OR SHORTNESS OF BREATH, Disp: 75 mL, Rfl: 3 .  budesonide-formoterol (SYMBICORT) 80-4.5 MCG/ACT inhaler, Inhale 2 puffs into the lungs 2 (two) times daily., Disp: 10.2 g, Rfl: 6   No Known Allergies  Past Medical History:  Diagnosis Date  . Asthma      Past Surgical History:  Procedure Laterality Date  . WISDOM TOOTH EXTRACTION      ROS  Objective:   Vitals: BP 110/75 (BP Location: Left Arm)   Pulse 87   Temp 97.6 F (36.4 C) (Oral)   Resp 16   SpO2 96%   Physical Exam Constitutional:      General: He is not in acute distress.    Appearance: Normal appearance. He is well-developed. He is not ill-appearing, toxic-appearing or diaphoretic.  HENT:     Head: Normocephalic and atraumatic.     Right Ear: External ear normal.     Left Ear: External ear normal.     Nose: Nose normal.     Mouth/Throat:     Mouth: Mucous membranes are moist.     Pharynx: Oropharynx is clear.  Eyes:   General: No scleral icterus.    Extraocular Movements: Extraocular movements intact.     Pupils: Pupils are equal, round, and reactive to light.  Cardiovascular:     Rate and Rhythm: Normal rate and regular rhythm.     Heart sounds: Normal heart sounds. No murmur. No friction rub. No gallop.   Pulmonary:     Effort: Pulmonary effort is normal. No respiratory distress.     Breath sounds: Normal breath sounds. No stridor. No wheezing, rhonchi or rales.  Chest:    Neurological:     Mental Status: He is alert and oriented to person, place, and time.  Psychiatric:        Mood and Affect: Mood normal.        Behavior: Behavior normal.        Thought Content: Thought content normal.      Assessment and Plan :   1. Costochondritis   2. Chest wall pain     We will manage conservatively for chest wall pain, costochondritis, chest wall strain with NSAID and muscle relaxant, rest and modification of physical activity.  Anticipatory guidance provided.  Currently there is no evidence of abscess or infection and therefore recommended patient not have any kind of I&D.  He was agreeable to this.  Counseled patient on potential for adverse effects with medications prescribed/recommended today, ER and return-to-clinic precautions discussed,  patient verbalized understanding.    Wallis Bamberg, PA-C 02/06/19 1226

## 2019-02-14 ENCOUNTER — Encounter: Payer: Self-pay | Admitting: Nurse Practitioner

## 2019-02-14 ENCOUNTER — Ambulatory Visit: Payer: Medicaid Other | Attending: Nurse Practitioner | Admitting: Nurse Practitioner

## 2019-02-14 ENCOUNTER — Other Ambulatory Visit: Payer: Self-pay

## 2019-02-14 DIAGNOSIS — F17210 Nicotine dependence, cigarettes, uncomplicated: Secondary | ICD-10-CM | POA: Diagnosis not present

## 2019-02-14 DIAGNOSIS — Z7951 Long term (current) use of inhaled steroids: Secondary | ICD-10-CM | POA: Diagnosis not present

## 2019-02-14 DIAGNOSIS — J4541 Moderate persistent asthma with (acute) exacerbation: Secondary | ICD-10-CM | POA: Diagnosis not present

## 2019-02-14 MED ORDER — ALBUTEROL SULFATE HFA 108 (90 BASE) MCG/ACT IN AERS: 2.0000 | INHALATION_SPRAY | RESPIRATORY_TRACT | 0 refills | Status: DC | PRN

## 2019-02-14 NOTE — Progress Notes (Signed)
Virtual Visit via Telephone Note Due to national recommendations of social distancing due to COVID 19, telehealth visit is felt to be most appropriate for this patient at this time.  I discussed the limitations, risks, security and privacy concerns of performing an evaluation and management service by telephone and the availability of in person appointments. I also discussed with the patient that there may be a patient responsible charge related to this service. The patient expressed understanding and agreed to proceed.    I connected with William May on 02/14/19  at   2:30 PM EST  EDT by telephone and verified that I am speaking with the correct person using two identifiers.   Consent I discussed the limitations, risks, security and privacy concerns of performing an evaluation and management service by telephone and the availability of in person appointments. I also discussed with the patient that there may be a patient responsible charge related to this service. The patient expressed understanding and agreed to proceed.   Location of Patient: Private Residence   Location of Provider: [facility]    Persons participating in Telemedicine visit: [name] YY [place] CMA Regenia Skeeter    History of Present Illness: Telemedicine visit for: Follow Up  has a past medical history of Asthma.   Breathing has improved.  Endorses medication compliance however when I asked him how he was taking his Symbicort he states 2 times a day.  He is also requesting another rescue albuterol inhaler.  We had a long discussion regarding Saba versus LABA and I instructed him that his Symbicort is scheduled for 2 puffs 2 times per day and not once per day.  Still smoking off and on. Can take up to 2 weeks sometimes to smoke one pack of cigarettes. Trying to cut back. Current medications: symbicort 80-4.5 2 puffs BID.     Past Medical History:  Diagnosis Date   . Asthma     Past Surgical History:  Procedure Laterality Date  . WISDOM TOOTH EXTRACTION      Family History  Problem Relation Age of Onset  . Hypertension Neg Hx     Social History   Socioeconomic History  . Marital status: Single    Spouse name: Not on file  . Number of children: Not on file  . Years of education: Not on file  . Highest education level: Not on file  Occupational History  . Not on file  Social Needs  . Financial resource strain: Not on file  . Food insecurity    Worry: Not on file    Inability: Not on file  . Transportation needs    Medical: Not on file    Non-medical: Not on file  Tobacco Use  . Smoking status: Current Every Day Smoker    Packs/day: 0.50    Years: 7.00    Pack years: 3.50    Types: Cigarettes  . Smokeless tobacco: Current User  Substance and Sexual Activity  . Alcohol use: No  . Drug use: No  . Sexual activity: Not Currently  Lifestyle  . Physical activity    Days per week: Not on file    Minutes per session: Not on file  . Stress: Not on file  Relationships  . Social Musician on phone: Not on file    Gets together: Not on file    Attends religious service: Not on file    Active member of club or  organization: Not on file    Attends meetings of clubs or organizations: Not on file    Relationship status: Not on file  Other Topics Concern  . Not on file  Social History Narrative  . Not on file     Observations/Objective: Awake, alert and oriented x 3   Review of Systems  Constitutional: Negative for fever, malaise/fatigue and weight loss.  HENT: Negative.  Negative for nosebleeds.   Eyes: Negative.  Negative for blurred vision, double vision and photophobia.  Respiratory: Negative.  Negative for cough, shortness of breath and wheezing.   Cardiovascular: Negative.  Negative for chest pain, palpitations and leg swelling.  Gastrointestinal: Negative.  Negative for heartburn, nausea and vomiting.   Musculoskeletal: Negative.  Negative for myalgias.  Neurological: Negative.  Negative for dizziness, focal weakness, seizures and headaches.  Psychiatric/Behavioral: Negative.  Negative for suicidal ideas.    Assessment and Plan: Mattheu was seen today for follow-up.  Diagnoses and all orders for this visit:  Moderate persistent asthma with acute exacerbation -     albuterol (PROVENTIL HFA) 108 (90 Base) MCG/ACT inhaler; Inhale 2 puffs into the lungs every 4 (four) hours as needed for wheezing or shortness of breath. DO NOT USE MORE THAN 2-3 times per week. Continue Symbicort 2 puffs 2 times a day as prescribed.  Discussed asthma action plan in detail today.  Follow Up Instructions Return in about 6 weeks (around 03/28/2019) for Asthma.     I discussed the assessment and treatment plan with the patient. The patient was provided an opportunity to ask questions and all were answered. The patient agreed with the plan and demonstrated an understanding of the instructions.   The patient was advised to call back or seek an in-person evaluation if the symptoms worsen or if the condition fails to improve as anticipated.  I provided 17 minutes of non-face-to-face time during this encounter including median intraservice time, reviewing previous notes, labs, imaging, medications and explaining diagnosis and management.  Gildardo Pounds, FNP-BC

## 2019-03-29 ENCOUNTER — Ambulatory Visit: Payer: Medicaid Other | Admitting: Nurse Practitioner

## 2019-08-21 ENCOUNTER — Other Ambulatory Visit: Payer: Self-pay | Admitting: Nurse Practitioner

## 2019-08-21 DIAGNOSIS — J4541 Moderate persistent asthma with (acute) exacerbation: Secondary | ICD-10-CM

## 2020-09-12 ENCOUNTER — Other Ambulatory Visit: Payer: Self-pay | Admitting: Nurse Practitioner

## 2020-09-12 DIAGNOSIS — J4541 Moderate persistent asthma with (acute) exacerbation: Secondary | ICD-10-CM

## 2020-09-12 NOTE — Telephone Encounter (Signed)
Copied from CRM [id number]. Topic: Quick Communication - Rx Refill/Question >> Sep 12, 2020  3:16 PM Jaquita Rector A wrote: Medication: albuterol (PROVENTIL HFA) 108 (90 Base) MCG/ACT inhaler, albuterol (PROVENTIL) (2.5 MG/3ML) 0.083% nebulizer solution  Has the patient contacted their pharmacy? Yes.   (Agent: If no, request that the patient contact the pharmacy for the refill.) (Agent: If yes, when and what did the pharmacy advise?)  Preferred Pharmacy (with phone number or street name): [id number] - [place], [place] - [address] AT [facility] [address]  Phone:  [phone] Fax:  [phone]     Agent: Please be advised that RX refills may take up to 3 business days. We ask that you follow-up with your pharmacy.

## 2020-09-12 NOTE — Telephone Encounter (Signed)
   Notes to clinic:  these scripts have expired  Review for continued use and refill    Requested Prescriptions  Pending Prescriptions Disp Refills   albuterol (PROVENTIL) (2.5 MG/3ML) 0.083% nebulizer solution 75 mL 3    Sig: INHALE CONTENTS OF 1 VIAL VIA NEBULIZER EVERY 6 HOURS AS NEEDED FOR WHEEZING OR SHORTNESS OF BREATH      Pulmonology:  Beta Agonists Failed - 09/12/2020  3:22 PM      Failed - One inhaler should last at least one month. If the patient is requesting refills earlier, contact the patient to check for uncontrolled symptoms.      Failed - Valid encounter within last 12 months    Recent Outpatient Visits           1 year ago Moderate persistent asthma with acute exacerbation   [place] [facility] [place], Shea Stakes, NP   1 year ago Moderate persistent asthma with acute exacerbation   [place] [facility] [place], Shea Stakes, NP   3 years ago Tobacco abuse   [place] [place], [place], MD   3 years ago Moderate persistent asthma with acute exacerbation   [place] [facility] [place], [place] E, MD   4 years ago Moderate persistent asthma with acute exacerbation   [facility] [place], Phylliss Blakes, MD       Future Appointments             In 1 month Claiborne Rigg, NP [place] [facility]               albuterol (PROVENTIL HFA) 108 (90 Base) MCG/ACT inhaler 18 g 0    Sig: Inhale 2 puffs into the lungs every 4 (four) hours as needed for wheezing or shortness of breath. DO NOT USE MORE THAN 2-3 times per week.      Pulmonology:  Beta Agonists Failed - 09/12/2020  3:22 PM      Failed - One inhaler should last at least one month. If the patient is requesting refills earlier, contact the patient to check for uncontrolled symptoms.      Failed - Valid encounter within last 12 months    Recent Outpatient Visits            1 year ago Moderate persistent asthma with acute exacerbation   [facility] [place], Shea Stakes, NP   1 year ago Moderate persistent asthma with acute exacerbation   [facility] [place], Shea Stakes, NP   3 years ago Tobacco abuse   [place] [facility] [place], [place], MD   3 years ago Moderate persistent asthma with acute exacerbation    [facility] [place], Phylliss Blakes, MD   4 years ago Moderate persistent asthma with acute exacerbation    [facility] [place], MD       Future Appointments             In 1 month Claiborne Rigg, NP [facility]

## 2020-09-14 NOTE — Addendum Note (Signed)
Addended by: Lois Huxley, Jeannett Senior L on: 09/14/2020 05:50 PM   Modules accepted: Orders

## 2020-09-14 NOTE — Telephone Encounter (Signed)
Pt was calling in regarding these prescriptions, pt requested a call back on the status, pt stated he is completely out and has a future appt scheduled. Please advise if a courtesy refill can be provided.

## 2020-09-14 NOTE — Telephone Encounter (Signed)
Pt has not been seen since 2020. Authorization will have to come from patient's PCP. Sending to her.

## 2020-11-06 ENCOUNTER — Encounter: Payer: Self-pay | Admitting: Nurse Practitioner

## 2020-11-06 ENCOUNTER — Ambulatory Visit: Payer: Medicaid Other | Attending: Nurse Practitioner | Admitting: Nurse Practitioner

## 2020-11-06 ENCOUNTER — Other Ambulatory Visit: Payer: Self-pay

## 2020-11-06 DIAGNOSIS — E875 Hyperkalemia: Secondary | ICD-10-CM

## 2020-11-06 DIAGNOSIS — D72829 Elevated white blood cell count, unspecified: Secondary | ICD-10-CM

## 2020-11-06 DIAGNOSIS — J4520 Mild intermittent asthma, uncomplicated: Secondary | ICD-10-CM

## 2020-11-06 DIAGNOSIS — Z1159 Encounter for screening for other viral diseases: Secondary | ICD-10-CM | POA: Diagnosis not present

## 2020-11-06 NOTE — Progress Notes (Signed)
Virtual Visit via Telephone Note Due to national recommendations of social distancing due to [place] 19, telehealth visit is felt to be most appropriate for this patient at this time.  I discussed the limitations, risks, security and privacy concerns of performing an evaluation and management service by telephone and the availability of in person appointments. I also discussed with the patient that there may be a patient responsible charge related to this service. The patient expressed understanding and agreed to proceed.    I connected with William May on 11/06/20  at   1:30 PM EDT  EDT by telephone and verified that I am speaking with the correct person using two identifiers.  Location of Patient: Private Residence   Location of Provider: [place] [facility] [place] participating in Telemedicine visit: Geryl Rankins FNP-BC BACILIO ABASCAL    History of Present Illness: Telemedicine visit for: Follow up on Asthma  Asthma Requesting refill of inhalers and nebulizers. Denies chest pain, worsening cough, wheezing or shortness of breath. He does endorse nocturnal dyspnea and has been out of his symbicort for several months. I have not seen this patient in a few years here in the office. He stopped smoking several months ago as well. I will send his medications once labs are complete.    Past Medical History:  Diagnosis Date   Asthma     Past Surgical History:  Procedure Laterality Date   WISDOM TOOTH EXTRACTION      Family History  Problem Relation Age of Onset   Hypertension Neg Hx     Social History   Socioeconomic History   Marital status: Single    Spouse name: Not on file   Number of children: Not on file   Years of education: Not on file   Highest education level: Not on file  Occupational History   Not on file  Tobacco Use   Smoking status: Former    Packs/day: 0.50    Years: 7.00    Pack years: 3.50    Types: Cigarettes     Quit date: 09/26/2019    Years since quitting: 1.1   Smokeless tobacco: Current  Vaping Use   Vaping Use: Never used  Substance and Sexual Activity   Alcohol use: No   Drug use: No   Sexual activity: Not Currently  Other Topics Concern   Not on file  Social History Narrative   Not on file   Social Determinants of Health   Financial Resource Strain: Not on file  Food Insecurity: Not on file  Transportation Needs: Not on file  Physical Activity: Not on file  Stress: Not on file  Social Connections: Not on file     Observations/Objective: Awake, alert and oriented x 3   Review of Systems  Constitutional:  Negative for fever, malaise/fatigue and weight loss.  HENT: Negative.  Negative for nosebleeds.   Eyes: Negative.  Negative for blurred vision, double vision and photophobia.  Respiratory: Negative.  Negative for cough and shortness of breath.   Cardiovascular: Negative.  Negative for chest pain, palpitations and leg swelling.  Gastrointestinal: Negative.  Negative for heartburn, nausea and vomiting.  Musculoskeletal: Negative.  Negative for myalgias.  Neurological: Negative.  Negative for dizziness, focal weakness, seizures and headaches.  Psychiatric/Behavioral: Negative.  Negative for suicidal ideas.    Assessment and Plan: Diagnoses and all orders for this visit:  Mild intermittent asthma without complication Stable  Continue inhaler use as prescribed.  Leukocytosis,  unspecified type -     CBC; Future  Hyperkalemia -     CMP14+EGFR; Future  Need for hepatitis C screening test -     HCV Ab w Reflex to Quant PCR; Future    Follow Up Instructions Return in about 3 months (around 02/06/2021) for physical.     I discussed the assessment and treatment plan with the patient. The patient was provided an opportunity to ask questions and all were answered. The patient agreed with the plan and demonstrated an understanding of the instructions.   The patient was  advised to call back or seek an in-person evaluation if the symptoms worsen or if the condition fails to improve as anticipated.  I provided 10 minutes of non-face-to-face time during this encounter including median intraservice time, reviewing previous notes, labs, imaging, medications and explaining diagnosis and management.  Gildardo Pounds, FNP-BC

## 2020-11-08 ENCOUNTER — Other Ambulatory Visit: Payer: Self-pay

## 2020-11-08 DIAGNOSIS — E875 Hyperkalemia: Secondary | ICD-10-CM | POA: Diagnosis not present

## 2020-11-08 DIAGNOSIS — Z1159 Encounter for screening for other viral diseases: Secondary | ICD-10-CM | POA: Diagnosis not present

## 2020-11-08 DIAGNOSIS — D72829 Elevated white blood cell count, unspecified: Secondary | ICD-10-CM | POA: Diagnosis not present

## 2020-11-08 NOTE — Addendum Note (Signed)
Addended byMemory Dance on: 11/08/2020 08:52 AM   Modules accepted: Orders

## 2020-11-09 ENCOUNTER — Other Ambulatory Visit: Payer: Self-pay | Admitting: Nurse Practitioner

## 2020-11-09 DIAGNOSIS — J4541 Moderate persistent asthma with (acute) exacerbation: Secondary | ICD-10-CM

## 2020-11-09 LAB — CBC
Hematocrit: 41.6 % (ref 37.5–51.0)
Hemoglobin: 13.6 g/dL (ref 13.0–17.7)
MCH: 31.8 pg (ref 26.6–33.0)
MCHC: 32.7 g/dL (ref 31.5–35.7)
MCV: 97 fL (ref 79–97)
Platelets: 357 10*3/uL (ref 150–450)
RBC: 4.28 x10E6/uL (ref 4.14–5.80)
RDW: 12.3 % (ref 11.6–15.4)
WBC: 6 10*3/uL (ref 3.4–10.8)

## 2020-11-09 LAB — CMP14+EGFR
ALT: 9 IU/L (ref 0–44)
AST: 15 IU/L (ref 0–40)
Albumin/Globulin Ratio: 2.4 — ABNORMAL HIGH (ref 1.2–2.2)
Albumin: 4.3 g/dL (ref 4.0–5.0)
Alkaline Phosphatase: 70 IU/L (ref 44–121)
BUN/Creatinine Ratio: 11 (ref 9–20)
BUN: 10 mg/dL (ref 6–20)
Bilirubin Total: 0.3 mg/dL (ref 0.0–1.2)
CO2: 23 mmol/L (ref 20–29)
Calcium: 9.1 mg/dL (ref 8.7–10.2)
Chloride: 104 mmol/L (ref 96–106)
Creatinine, Ser: 0.89 mg/dL (ref 0.76–1.27)
Globulin, Total: 1.8 g/dL (ref 1.5–4.5)
Glucose: 92 mg/dL (ref 65–99)
Potassium: 4.5 mmol/L (ref 3.5–5.2)
Sodium: 140 mmol/L (ref 134–144)
Total Protein: 6.1 g/dL (ref 6.0–8.5)
eGFR: 115 mL/min/{1.73_m2} (ref >59)

## 2020-11-09 LAB — HCV AB W REFLEX TO QUANT PCR: HCV Ab: <0.1 s/co ratio (ref 0.0–0.9)

## 2020-11-09 LAB — HCV INTERPRETATION

## 2020-11-09 MED ORDER — ALBUTEROL SULFATE (2.5 MG/3ML) 0.083% IN NEBU: INHALATION_SOLUTION | RESPIRATORY_TRACT | 3 refills | Status: DC

## 2020-11-09 MED ORDER — ALBUTEROL SULFATE HFA 108 (90 BASE) MCG/ACT IN AERS: 2.0000 | INHALATION_SPRAY | RESPIRATORY_TRACT | 0 refills | Status: DC | PRN

## 2020-11-09 MED ORDER — BUDESONIDE-FORMOTEROL FUMARATE 80-4.5 MCG/ACT IN AERO: 2.0000 | INHALATION_SPRAY | Freq: Two times a day (BID) | RESPIRATORY_TRACT | 6 refills | Status: DC

## 2020-11-25 ENCOUNTER — Other Ambulatory Visit: Payer: Self-pay | Admitting: Nurse Practitioner

## 2020-11-25 DIAGNOSIS — J4541 Moderate persistent asthma with (acute) exacerbation: Secondary | ICD-10-CM

## 2020-11-25 NOTE — Telephone Encounter (Signed)
Requested medication (s) are due for refill today: no  Requested medication (s) are on the active medication list: yes  Last refill:  11/09/20 18g  Future visit scheduled: no  Notes to clinic:  prescription request sent 2 weeks after last refill- attempted to call pt but unable to LM on VM   Requested Prescriptions  Pending Prescriptions Disp Refills   PROAIR HFA 108 (90 Base) MCG/ACT inhaler [Pharmacy Med Name: PROAIR HFA ORAL INH (200  PFS) 8.5G] 8.5 g     Sig: INHALE 2 PUFFS INTO THE LUNGS EVERY 4 HOURS AS NEEDED FOR WHEEZING OR SHORTNESS OF BREATH. DO NOT USE MORE THAN 2-3 TIMES EVERY WEEK     Pulmonology:  Beta Agonists Failed - 11/25/2020  3:32 AM      Failed - One inhaler should last at least one month. If the patient is requesting refills earlier, contact the patient to check for uncontrolled symptoms.      Passed - Valid encounter within last 12 months    Recent Outpatient Visits           2 weeks ago Mild intermittent asthma without complication   [place] [facility] [place], [place] W, NP   1 year ago Moderate persistent asthma with acute exacerbation   [facility] [place], Shea Stakes, NP   1 year ago Moderate persistent asthma with acute exacerbation   [place] [facility] [place], Shea Stakes, NP   3 years ago Tobacco abuse   [facility] [place], [place] Horns, MD   4 years ago Moderate persistent asthma with acute exacerbation   [facility] [place], Phylliss Blakes, MD

## 2021-02-28 ENCOUNTER — Other Ambulatory Visit: Payer: Self-pay | Admitting: Family Medicine

## 2021-02-28 DIAGNOSIS — J4541 Moderate persistent asthma with (acute) exacerbation: Secondary | ICD-10-CM

## 2021-07-26 ENCOUNTER — Other Ambulatory Visit: Payer: Self-pay | Admitting: Family Medicine

## 2021-07-26 DIAGNOSIS — J4541 Moderate persistent asthma with (acute) exacerbation: Secondary | ICD-10-CM

## 2021-07-29 NOTE — Telephone Encounter (Signed)
Requested Prescriptions  ?Pending Prescriptions Disp Refills  ?? VENTOLIN HFA 108 (90 Base) MCG/ACT inhaler [Pharmacy Med Name: VENTOLIN HFA INH W/DOS CTR 200PUFFS] 18 g 0  ?  Sig: INHALE 2 PUFFS INTO THE LUNGS EVERY 4 HOURS AS NEEDED FOR WHEEZING OR SHORTNESS OF BREATH. DO NOT USE MORE THAN 2 TO 3 TIMES EVERY WEEK  ?  ? Pulmonology:  Beta Agonists 2 Passed - 07/26/2021  4:41 PM  ?  ?  Passed - Last BP in normal range  ?  BP Readings from Last 1 Encounters:  ?02/05/19 110/75  ?   ?  ?  Passed - Last Heart Rate in normal range  ?  Pulse Readings from Last 1 Encounters:  ?02/05/19 87  ?   ?  ?  Passed - Valid encounter within last 12 months  ?  Recent Outpatient Visits   ?      ? 8 months ago Mild intermittent asthma without complication  ? [facility] [place], [place] W, NP  ? 2 years ago Moderate persistent asthma with acute exacerbation  ? [facility] Well[facility] [place], [place] W, NP  ? 2 years ago Moderate persistent asthma with acute exacerbation  ? [facility] And Wellness [place], [place] W, NP  ? 4 years ago Tobacco abuse  ? [facility] [place], Odette Horns, MD  ? 4 years ago Moderate persistent asthma with acute exacerbation  ? [facility] [place], Phylliss Blakes, MD  ?  ?  ? ?  ?  ?  ? ?

## 2021-09-04 ENCOUNTER — Other Ambulatory Visit: Payer: Self-pay | Admitting: Nurse Practitioner

## 2021-09-04 DIAGNOSIS — J4541 Moderate persistent asthma with (acute) exacerbation: Secondary | ICD-10-CM

## 2021-09-04 NOTE — Telephone Encounter (Signed)
Requested Prescriptions  Pending Prescriptions Disp Refills  . albuterol (PROVENTIL) (2.5 MG/3ML) 0.083% nebulizer solution [Pharmacy Med Name: ALBUTEROL 0.083%(2.5MG /3ML) 25X3ML] 75 mL 3    Sig: INHALE THE CONTENTS OF 1 VIAL VIA NEBULIZER EVERY 6 HOURS AS NEEDED FOR WHEEZING OR SHORTNESS OF BREATH     Pulmonology:  Beta Agonists 2 Passed - 09/04/2021  5:38 PM      Passed - Last BP in normal range    BP Readings from Last 1 Encounters:  02/05/19 110/75         Passed - Last Heart Rate in normal range    Pulse Readings from Last 1 Encounters:  02/05/19 87         Passed - Valid encounter within last 12 months    Recent Outpatient Visits          10 months ago Mild intermittent asthma without complication   [place] [facility] And Wellness [place], [place] W, NP   2 years ago Moderate persistent asthma with acute exacerbation   [facility] [place], [place] W, NP   2 years ago Moderate persistent asthma with acute exacerbation   [place] [facility] Wellness [place], Shea Stakes, NP   4 years ago Tobacco abuse    [facility] [place], [place], MD   4 years ago Moderate persistent asthma with acute exacerbation   [facility] [place], Phylliss Blakes, MD

## 2021-09-21 ENCOUNTER — Other Ambulatory Visit: Payer: Self-pay | Admitting: Nurse Practitioner

## 2021-09-21 DIAGNOSIS — J4541 Moderate persistent asthma with (acute) exacerbation: Secondary | ICD-10-CM

## 2021-10-15 ENCOUNTER — Other Ambulatory Visit: Payer: Self-pay | Admitting: Nurse Practitioner

## 2021-10-15 DIAGNOSIS — J4541 Moderate persistent asthma with (acute) exacerbation: Secondary | ICD-10-CM

## 2021-10-15 NOTE — Telephone Encounter (Unsigned)
Copied from CRM [id number]. Topic: General - Other >> Oct 15, 2021 12:05 PM [name] wrote: Reason for CRM: Medication Refill - Medication: albuterol (PROVENTIL) (2.5 MG/3ML) 0.083% nebulizer solution [[id number]]   Has the patient contacted their pharmacy? Yes.  The patient has been directed to contact their PCP (Agent: If no, request that the patient contact the pharmacy for the refill. If patient does not wish to contact the pharmacy document the reason why and proceed with request.) (Agent: If yes, when and what did the pharmacy advise?)  Preferred Pharmacy (with phone number or street name): [facility] [phone] - [place], [place] - [address] AT [facility] [address] & [address] [place] [address] Phone: [phone] Fax: [phone] Hours: Not open 24 hours  Has the patient been seen for an appointment in the last year OR does the patient have an upcoming appointment? Yes.    Agent: Please be advised that RX refills may take up to 3 business days. We ask that you follow-up with your pharmacy.

## 2021-10-16 NOTE — Telephone Encounter (Signed)
Requested medication (s) are due for refill today: - unsure- seems too early  Requested medication (s) are on the active medication list: yes  Last refill:  09/04/21 75 ml 1 RF  Future visit scheduled: no  Notes to clinic:  Called pharmacy x 2 to find out if still has 1 RF and hold time was too long/attempted to call and speak to pt but "call cannot be completed at this time"   Requested Prescriptions  Pending Prescriptions Disp Refills   albuterol (PROVENTIL) (2.5 MG/3ML) 0.083% nebulizer solution 75 mL 1    Sig: INHALE THE CONTENTS OF 1 VIAL VIA NEBULIZER EVERY 6 HOURS AS NEEDED FOR WHEEZING OR SHORTNESS OF BREATH     Pulmonology:  Beta Agonists 2 Passed - 10/15/2021 12:29 PM      Passed - Last BP in normal range    BP Readings from Last 1 Encounters:  02/05/19 110/75         Passed - Last Heart Rate in normal range    Pulse Readings from Last 1 Encounters:  02/05/19 87         Passed - Valid encounter within last 12 months    Recent Outpatient Visits           11 months ago Mild intermittent asthma without complication   [place] [facility] Wellness [place], [place] W, NP   2 years ago Moderate persistent asthma with acute exacerbation   [facility] [place], [place] W, NP   2 years ago Moderate persistent asthma with acute exacerbation   [place] [facility] [place], Shea Stakes, NP   4 years ago Tobacco abuse    [address] And Wellness [place], [place], MD   5 years ago Moderate persistent asthma with acute exacerbation   [facility] [place], Phylliss Blakes, MD       Future Appointments             In 1 month Claiborne Rigg, NP [facility]

## 2021-10-24 ENCOUNTER — Other Ambulatory Visit: Payer: Self-pay | Admitting: Nurse Practitioner

## 2021-10-24 DIAGNOSIS — J4541 Moderate persistent asthma with (acute) exacerbation: Secondary | ICD-10-CM

## 2021-11-08 ENCOUNTER — Other Ambulatory Visit: Payer: Self-pay | Admitting: Family Medicine

## 2021-11-08 DIAGNOSIS — J4541 Moderate persistent asthma with (acute) exacerbation: Secondary | ICD-10-CM

## 2021-11-08 NOTE — Telephone Encounter (Signed)
Requested medication (s) are due for refill today:   No   Requested medication (s) are on the active medication list:   Yes  Future visit scheduled:   Yes 11/22/2021 with Zelda   Last ordered: 10/24/2021 18 g, 0 refills  Returned because per note OV needed for refills.   Requested Prescriptions  Pending Prescriptions Disp Refills   VENTOLIN HFA 108 (90 Base) MCG/ACT inhaler [Pharmacy Med Name: VENTOLIN HFA INH W/DOS CTR 200PUFFS] 18 g 0    Sig: INHALE 2 PUFFS INTO THE LUNGS EVERY 4 HOURS AS NEEDED FOR WHEEZING OR SHORTNESS OF BREATH. DO NOT USE MORE THAN 2 TO 3 TIMES EVERY WEEK     Pulmonology:  Beta Agonists 2 Failed - 11/08/2021  2:10 PM      Failed - Valid encounter within last 12 months    Recent Outpatient Visits           1 year ago Mild intermittent asthma without complication   [place] [facility] [place], [place] W, NP   2 years ago Moderate persistent asthma with acute exacerbation   [facility] Well[facility] [place], [place] W, NP   2 years ago Moderate persistent asthma with acute exacerbation   [place] [facility] Wellness [place], Shea Stakes, NP   4 years ago Tobacco abuse   [facility] [place], [place], MD   5 years ago Moderate persistent asthma with acute exacerbation   [facility] And Wellness [place], Phylliss Blakes, MD       Future Appointments             In 2 weeks Claiborne Rigg, NP [facility]            Passed - Last BP in normal range    BP Readings from Last 1 Encounters:  02/05/19 110/75         Passed - Last Heart Rate in normal range    Pulse Readings from Last 1 Encounters:  02/05/19 87

## 2021-11-22 ENCOUNTER — Ambulatory Visit: Payer: Medicaid Other | Attending: Nurse Practitioner | Admitting: Nurse Practitioner

## 2021-11-22 ENCOUNTER — Encounter: Payer: Self-pay | Admitting: Nurse Practitioner

## 2021-11-22 VITALS — BP 115/74 | HR 76 | Temp 98.1°F | Ht 69.0 in | Wt 135.4 lb

## 2021-11-22 DIAGNOSIS — J4541 Moderate persistent asthma with (acute) exacerbation: Secondary | ICD-10-CM | POA: Diagnosis not present

## 2021-11-22 DIAGNOSIS — Z7251 High risk heterosexual behavior: Secondary | ICD-10-CM

## 2021-11-22 DIAGNOSIS — Z0000 Encounter for general adult medical examination without abnormal findings: Secondary | ICD-10-CM

## 2021-11-22 DIAGNOSIS — D72829 Elevated white blood cell count, unspecified: Secondary | ICD-10-CM | POA: Diagnosis not present

## 2021-11-22 DIAGNOSIS — E875 Hyperkalemia: Secondary | ICD-10-CM | POA: Diagnosis not present

## 2021-11-22 MED ORDER — ALBUTEROL SULFATE (2.5 MG/3ML) 0.083% IN NEBU: INHALATION_SOLUTION | RESPIRATORY_TRACT | 1 refills | Status: DC

## 2021-11-22 MED ORDER — BUDESONIDE-FORMOTEROL FUMARATE 80-4.5 MCG/ACT IN AERO: 2.0000 | INHALATION_SPRAY | Freq: Two times a day (BID) | RESPIRATORY_TRACT | 6 refills | Status: DC

## 2021-11-22 NOTE — Progress Notes (Signed)
Assessment & Plan:  William May was seen today for annual exam.  Diagnoses and all orders for this visit:  Encounter for annual physical exam -     CMP14+EGFR -     CBC with Differential -     Lipid panel  Leukocytosis, unspecified type -     CBC with Differential  Hyperkalemia -     CMP14+EGFR  Moderate persistent asthma with acute exacerbation Reports symptoms as well controlled -     albuterol (PROVENTIL) (2.5 MG/3ML) 0.083% nebulizer solution; INHALE THE CONTENTS OF 1 VIAL VIA NEBULIZER EVERY 6 HOURS AS NEEDED FOR WHEEZING OR SHORTNESS OF BREATH -     budesonide-formoterol (SYMBICORT) 80-4.5 MCG/ACT inhaler; Inhale 2 puffs into the lungs 2 (two) times daily.  High risk sexual behavior, unspecified type -     HIV antibody (with reflex) -     RPR    Patient has been counseled on age-appropriate routine health concerns for screening and prevention. These are reviewed and up-to-date. Referrals have been placed accordingly. Immunizations are up-to-date or declined.    Subjective:   Chief Complaint  Patient presents with   Annual Exam   HPI William May 36 y.o. male presents to office today for annual physical exam.   Review of Systems  Constitutional:  Negative for fever, malaise/fatigue and weight loss.  HENT: Negative.  Negative for nosebleeds.   Eyes: Negative.  Negative for blurred vision, double vision and photophobia.  Respiratory: Negative.  Negative for cough and shortness of breath.   Cardiovascular: Negative.  Negative for chest pain, palpitations and leg swelling.  Gastrointestinal: Negative.  Negative for heartburn, nausea and vomiting.  Genitourinary: Negative.   Musculoskeletal: Negative.  Negative for myalgias.  Skin: Negative.   Neurological: Negative.  Negative for dizziness, focal weakness, seizures and headaches.  Endo/Heme/Allergies: Negative.   Psychiatric/Behavioral: Negative.  Negative for suicidal ideas.     Past Medical History:   Diagnosis Date   Asthma     Past Surgical History:  Procedure Laterality Date   WISDOM TOOTH EXTRACTION      Family History  Problem Relation Age of Onset   Hypertension Neg Hx     Social History Reviewed with no changes to be made today.   Outpatient Medications Prior to Visit  Medication Sig Dispense Refill   VENTOLIN HFA 108 (90 Base) MCG/ACT inhaler INHALE 2 PUFFS INTO THE LUNGS EVERY 4 HOURS AS NEEDED FOR WHEEZING OR SHORTNESS OF BREATH. DO NOT USE MORE THAN 2 TO 3 TIMES EVERY WEEK 18 g 0   albuterol (PROVENTIL) (2.5 MG/3ML) 0.083% nebulizer solution INHALE THE CONTENTS OF 1 VIAL VIA NEBULIZER EVERY 6 HOURS AS NEEDED FOR WHEEZING OR SHORTNESS OF BREATH 75 mL 1   budesonide-formoterol (SYMBICORT) 80-4.5 MCG/ACT inhaler Inhale 2 puffs into the lungs 2 (two) times daily. (Patient not taking: Reported on 11/22/2021) 10.2 g 6   No facility-administered medications prior to visit.    No Known Allergies     Objective:    BP 115/74   Pulse 76   Temp 98.1 F (36.7 C) (Oral)   Ht _0  (1.753 m)   Wt 135 lb 6.4 oz (61.4 kg)   SpO2 97%   BMI 20.00 kg/m  Wt Readings from Last 3 Encounters:  11/22/21 135 lb 6.4 oz (61.4 kg)  07/10/17 138 lb (62.6 kg)  11/28/16 133 lb (60.3 kg)    Physical Exam Constitutional:      Appearance: He is well-developed.  HENT:  Head: Normocephalic and atraumatic.     Right Ear: Hearing, tympanic membrane, ear canal and external ear normal.     Left Ear: Hearing, tympanic membrane, ear canal and external ear normal.     Nose: Nose normal. No mucosal edema or rhinorrhea.     Right Turbinates: Not enlarged.     Left Turbinates: Not enlarged.     Mouth/Throat:     Lips: Pink.     Mouth: Mucous membranes are moist.     Dentition: No gingival swelling, dental abscesses or gum lesions.     Pharynx: Uvula midline.     Tonsils: No tonsillar exudate. 1+ on the right. 1+ on the left.  Eyes:     General: Lids are normal. No scleral icterus.     Extraocular Movements: Extraocular movements intact.     Conjunctiva/sclera: Conjunctivae normal.     Pupils: Pupils are equal, round, and reactive to light.  Neck:     Thyroid: No thyromegaly.     Trachea: No tracheal deviation.  Cardiovascular:     Rate and Rhythm: Normal rate and regular rhythm.     Heart sounds: Normal heart sounds. No murmur heard.    No friction rub. No gallop.  Pulmonary:     Effort: Pulmonary effort is normal. No respiratory distress.     Breath sounds: Examination of the right-lower field reveals wheezing. Examination of the left-lower field reveals wheezing. Wheezing present. No rhonchi or rales.  Chest:     Chest wall: No mass or tenderness.  Breasts:    Right: No inverted nipple, mass, nipple discharge, skin change or tenderness.     Left: No inverted nipple, mass, nipple discharge, skin change or tenderness.  Abdominal:     General: Bowel sounds are normal. There is no distension.     Palpations: Abdomen is soft. There is no mass.     Tenderness: There is no abdominal tenderness. There is no guarding or rebound.  Musculoskeletal:        General: No tenderness or deformity. Normal range of motion.     Cervical back: Normal range of motion and neck supple.  Lymphadenopathy:     Cervical: No cervical adenopathy.  Skin:    General: Skin is warm and dry.     Capillary Refill: Capillary refill takes less than 2 seconds.     Findings: No erythema.  Neurological:     Mental Status: He is alert and oriented to person, place, and time.     Cranial Nerves: No cranial nerve deficit.     Sensory: Sensation is intact.     Motor: No abnormal muscle tone.     Coordination: Coordination is intact. Coordination normal.     Gait: Gait is intact.     Deep Tendon Reflexes: Reflexes normal.     Reflex Scores:      Patellar reflexes are 1+ on the right side and 1+ on the left side. Psychiatric:        Attention and Perception: Attention normal.        Mood and  Affect: Mood normal.        Speech: Speech normal.        Behavior: Behavior normal.        Thought Content: Thought content normal.        Judgment: Judgment normal.          Patient has been counseled extensively about nutrition and exercise as well as the importance of adherence with  medications and regular follow-up. The patient was given clear instructions to go to ER or return to medical center if symptoms don't improve, worsen or new problems develop. The patient verbalized understanding.   Follow-up: Return in about 6 months (around 05/25/2022).   Gildardo Pounds, FNP-BC [facility] and [place], [place]   11/22/2021, 1:50 PM

## 2021-11-23 LAB — CBC WITH DIFFERENTIAL/PLATELET
Basophils Absolute: 0.1 10*3/uL (ref 0.0–0.2)
Basos: 1 %
EOS (ABSOLUTE): 0.3 10*3/uL (ref 0.0–0.4)
Eos: 5 %
Hematocrit: 42.1 % (ref 37.5–51.0)
Hemoglobin: 14.4 g/dL (ref 13.0–17.7)
Immature Grans (Abs): 0 10*3/uL (ref 0.0–0.1)
Immature Granulocytes: 0 %
Lymphocytes Absolute: 1.8 10*3/uL (ref 0.7–3.1)
Lymphs: 37 %
MCH: 32.2 pg (ref 26.6–33.0)
MCHC: 34.2 g/dL (ref 31.5–35.7)
MCV: 94 fL (ref 79–97)
Monocytes Absolute: 0.3 10*3/uL (ref 0.1–0.9)
Monocytes: 6 %
Neutrophils Absolute: 2.5 10*3/uL (ref 1.4–7.0)
Neutrophils: 51 %
Platelets: 380 10*3/uL (ref 150–450)
RBC: 4.47 x10E6/uL (ref 4.14–5.80)
RDW: 11.7 % (ref 11.6–15.4)
WBC: 4.9 10*3/uL (ref 3.4–10.8)

## 2021-11-23 LAB — CMP14+EGFR
ALT: 7 IU/L (ref 0–44)
AST: 14 IU/L (ref 0–40)
Albumin/Globulin Ratio: 2.7 — ABNORMAL HIGH (ref 1.2–2.2)
Albumin: 4.8 g/dL (ref 4.1–5.1)
Alkaline Phosphatase: 80 IU/L (ref 44–121)
BUN/Creatinine Ratio: 6 — ABNORMAL LOW (ref 9–20)
BUN: 6 mg/dL (ref 6–20)
Bilirubin Total: 0.3 mg/dL (ref 0.0–1.2)
CO2: 24 mmol/L (ref 20–29)
Calcium: 9.6 mg/dL (ref 8.7–10.2)
Chloride: 104 mmol/L (ref 96–106)
Creatinine, Ser: 0.98 mg/dL (ref 0.76–1.27)
Globulin, Total: 1.8 g/dL (ref 1.5–4.5)
Glucose: 95 mg/dL (ref 70–99)
Potassium: 4.9 mmol/L (ref 3.5–5.2)
Sodium: 143 mmol/L (ref 134–144)
Total Protein: 6.6 g/dL (ref 6.0–8.5)
eGFR: 103 mL/min/{1.73_m2} (ref >59)

## 2021-11-23 LAB — LIPID PANEL
Chol/HDL Ratio: 3.5 ratio (ref 0.0–5.0)
Cholesterol, Total: 130 mg/dL (ref 100–199)
HDL: 37 mg/dL — ABNORMAL LOW (ref >39)
LDL Chol Calc (NIH): 81 mg/dL (ref 0–99)
Triglycerides: 55 mg/dL (ref 0–149)
VLDL Cholesterol Cal: 12 mg/dL (ref 5–40)

## 2021-11-23 LAB — HIV ANTIBODY (ROUTINE TESTING W REFLEX): HIV Screen 4th Generation wRfx: NONREACTIVE

## 2021-11-23 LAB — RPR: RPR Ser Ql: NONREACTIVE

## 2022-03-25 ENCOUNTER — Other Ambulatory Visit: Payer: Self-pay | Admitting: Family Medicine

## 2022-03-25 DIAGNOSIS — J4541 Moderate persistent asthma with (acute) exacerbation: Secondary | ICD-10-CM

## 2022-05-26 ENCOUNTER — Ambulatory Visit: Payer: Medicaid Other | Attending: Nurse Practitioner | Admitting: Nurse Practitioner

## 2022-05-26 ENCOUNTER — Encounter: Payer: Self-pay | Admitting: Nurse Practitioner

## 2022-05-26 VITALS — BP 104/63 | HR 79 | Ht 69.0 in | Wt 140.4 lb

## 2022-05-26 DIAGNOSIS — F5101 Primary insomnia: Secondary | ICD-10-CM | POA: Diagnosis not present

## 2022-05-26 DIAGNOSIS — J4541 Moderate persistent asthma with (acute) exacerbation: Secondary | ICD-10-CM

## 2022-05-26 DIAGNOSIS — R7309 Other abnormal glucose: Secondary | ICD-10-CM

## 2022-05-26 MED ORDER — VENTOLIN HFA 108 (90 BASE) MCG/ACT IN AERS: 1.0000 | INHALATION_SPRAY | Freq: Four times a day (QID) | RESPIRATORY_TRACT | 1 refills | Status: DC | PRN

## 2022-05-26 MED ORDER — TRAZODONE HCL 100 MG PO TABS: 100.0000 mg | ORAL_TABLET | Freq: Every day | ORAL | 1 refills | Status: DC

## 2022-05-26 MED ORDER — SYMBICORT 80-4.5 MCG/ACT IN AERO: 2.0000 | INHALATION_SPRAY | Freq: Two times a day (BID) | RESPIRATORY_TRACT | 6 refills | Status: DC

## 2022-05-26 NOTE — Progress Notes (Signed)
Assessment & Plan:  Havoc was seen today for asthma and insomnia.  Diagnoses and all orders for this visit:  Moderate persistent asthma with acute exacerbation -     budesonide-formoterol (SYMBICORT) 80-4.5 MCG/ACT inhaler; Inhale 2 puffs into the lungs 2 (two) times daily. -     VENTOLIN HFA 108 (90 Base) MCG/ACT inhaler; Inhale 1-2 puffs into the lungs every 6 (six) hours as needed for wheezing or shortness of breath.  Primary insomnia -     traZODone (DESYREL) 100 MG tablet; Take 1 tablet (100 mg total) by mouth at bedtime.  Elevated glucose -     CMP14+EGFR    Patient has been counseled on age-appropriate routine health concerns for screening and prevention. These are reviewed and up-to-date. Referrals have been placed accordingly. Immunizations are up-to-date or declined.    Subjective:   Chief Complaint  Patient presents with   Asthma   Insomnia   HPI William May 37 y.o. male presents to office today for asthma which is well controlled today. Still smoking "off and on". Trying to cut back.   Notes insomnia for the past few years. Difficulty falling asleep and staying asleep.   Review of Systems  Constitutional:  Negative for fever, malaise/fatigue and weight loss.  HENT: Negative.  Negative for nosebleeds.   Eyes: Negative.  Negative for blurred vision, double vision and photophobia.  Respiratory: Negative.  Negative for cough and shortness of breath.   Cardiovascular: Negative.  Negative for chest pain, palpitations and leg swelling.  Gastrointestinal: Negative.  Negative for heartburn, nausea and vomiting.  Musculoskeletal: Negative.  Negative for myalgias.  Neurological: Negative.  Negative for dizziness, focal weakness, seizures and headaches.  Psychiatric/Behavioral:  Negative for suicidal ideas. The patient has insomnia.     Past Medical History:  Diagnosis Date   Asthma     Past Surgical History:  Procedure Laterality Date   WISDOM TOOTH  EXTRACTION      Family History  Problem Relation Age of Onset   Hypertension Neg Hx     Social History Reviewed with no changes to be made today.   Outpatient Medications Prior to Visit  Medication Sig Dispense Refill   albuterol (PROVENTIL) (2.5 MG/3ML) 0.083% nebulizer solution INHALE THE CONTENTS OF 1 VIAL VIA NEBULIZER EVERY 6 HOURS AS NEEDED FOR WHEEZING OR SHORTNESS OF BREATH 75 mL 1   budesonide-formoterol (SYMBICORT) 80-4.5 MCG/ACT inhaler Inhale 2 puffs into the lungs 2 (two) times daily. 10.2 g 6   VENTOLIN HFA 108 (90 Base) MCG/ACT inhaler INHALE 2 PUFFS INTO THE LUNGS EVERY 4 HOURS AS NEEDED FOR WHEEZING OR SHORTNESS OF BREATH. DO NOT USE MORE THAN 2 TO 3 TIMES EVERY WEEK (Patient not taking: Reported on 05/26/2022) 18 g 0   No facility-administered medications prior to visit.    No Known Allergies     Objective:    BP 104/63   Pulse 79   Ht '5\' 9"'$  (1.753 m)   Wt 140 lb 6.4 oz (63.7 kg)   SpO2 96%   BMI 20.73 kg/m  Wt Readings from Last 3 Encounters:  05/26/22 140 lb 6.4 oz (63.7 kg)  11/22/21 135 lb 6.4 oz (61.4 kg)  07/10/17 138 lb (62.6 kg)    Physical Exam Vitals and nursing note reviewed.  Constitutional:      Appearance: He is well-developed.  HENT:     Head: Normocephalic and atraumatic.  Cardiovascular:     Rate and Rhythm: Normal rate and regular rhythm.  Heart sounds: Normal heart sounds. No murmur heard.    No friction rub. No gallop.  Pulmonary:     Effort: Pulmonary effort is normal. No tachypnea or respiratory distress.     Breath sounds: Normal breath sounds. No decreased breath sounds, wheezing, rhonchi or rales.  Chest:     Chest wall: No tenderness.  Abdominal:     General: Bowel sounds are normal.     Palpations: Abdomen is soft.  Musculoskeletal:        General: Normal range of motion.     Cervical back: Normal range of motion.  Skin:    General: Skin is warm and dry.  Neurological:     Mental Status: He is alert and  oriented to person, place, and time.     Coordination: Coordination normal.  Psychiatric:        Behavior: Behavior normal. Behavior is cooperative.        Thought Content: Thought content normal.        Judgment: Judgment normal.          Patient has been counseled extensively about nutrition and exercise as well as the importance of adherence with medications and regular follow-up. The patient was given clear instructions to go to ER or return to medical center if symptoms don't improve, worsen or new problems develop. The patient verbalized understanding.   Follow-up: Return in about 6 months (around 11/24/2022).   Gildardo Pounds, FNP-BC [facility] and [facility], [place]   06/04/2022, 11:25 PM

## 2022-05-26 NOTE — Progress Notes (Signed)
Trouble falling and staying asleep. On average patient is getting 1 1/2 hour of sleep.

## 2022-05-27 LAB — CMP14+EGFR
ALT: 14 IU/L (ref 0–44)
AST: 14 IU/L (ref 0–40)
Albumin/Globulin Ratio: 2.9 — ABNORMAL HIGH (ref 1.2–2.2)
Albumin: 4.6 g/dL (ref 4.1–5.1)
Alkaline Phosphatase: 75 IU/L (ref 44–121)
BUN/Creatinine Ratio: 12 (ref 9–20)
BUN: 11 mg/dL (ref 6–20)
Bilirubin Total: 0.3 mg/dL (ref 0.0–1.2)
CO2: 21 mmol/L (ref 20–29)
Calcium: 9.6 mg/dL (ref 8.7–10.2)
Chloride: 103 mmol/L (ref 96–106)
Creatinine, Ser: 0.9 mg/dL (ref 0.76–1.27)
Globulin, Total: 1.6 g/dL (ref 1.5–4.5)
Glucose: 90 mg/dL (ref 70–99)
Potassium: 4.6 mmol/L (ref 3.5–5.2)
Sodium: 141 mmol/L (ref 134–144)
Total Protein: 6.2 g/dL (ref 6.0–8.5)
eGFR: 114 mL/min/{1.73_m2} (ref >59)

## 2022-06-04 ENCOUNTER — Encounter: Payer: Self-pay | Admitting: Nurse Practitioner

## 2022-06-30 ENCOUNTER — Ambulatory Visit
Admission: RE | Admit: 2022-06-30 | Discharge: 2022-06-30 | Disposition: A | Discharge status: Home / Self Care | Payer: Medicaid Other | Source: Ambulatory Visit | Attending: Physician Assistant | Admitting: Physician Assistant

## 2022-06-30 VITALS — BP 125/78 | HR 84 | Temp 97.8°F | Resp 16

## 2022-06-30 DIAGNOSIS — L299 Pruritus, unspecified: Secondary | ICD-10-CM

## 2022-06-30 DIAGNOSIS — L42 Pityriasis rosea: Secondary | ICD-10-CM | POA: Diagnosis not present

## 2022-06-30 MED ORDER — LORATADINE 10 MG PO TABS: 10.0000 mg | ORAL_TABLET | Freq: Every day | ORAL | 0 refills | Status: DC

## 2022-06-30 MED ORDER — HYDROXYZINE HCL 10 MG PO TABS: 10.0000 mg | ORAL_TABLET | Freq: Three times a day (TID) | ORAL | 0 refills | Status: DC | PRN

## 2022-06-30 MED ORDER — PREDNISONE 10 MG PO TABS: 10.0000 mg | ORAL_TABLET | Freq: Three times a day (TID) | ORAL | 0 refills | Status: DC

## 2022-06-30 NOTE — ED Provider Notes (Signed)
[facility]    CSN: [id number] Arrival date & time: 06/30/22  1401      History   Chief Complaint Chief Complaint  Patient presents with   Rash    Entered by patient    HPI William May is a 37 y.o. male.   37 year old male presents with itching rash.  Patient indicates for the past 2 weeks he has been had a progressive rash with itching.  He indicates that the rash started after he began using a new soap, body wash.  Patient indicates that it started on the chest and then has spread to the back, upper arms, and the neck, and the head and down to the upper thighs bilaterally.  Patient indicates that it itches and is the same during the night as it is during the day.  Patient indicates he has not been around any other individuals with similar rash, he is does not have fever, chills, cough, congestion or nausea or vomiting.  Patient indicates he has not started any new foods, medications, or other new colognes or detergents.   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 Authorizing Provider  hydrOXYzine (ATARAX) 10 MG tablet Take 1 tablet (10 mg total) by mouth 3 (three) times daily as needed. 06/30/22  Yes Nyoka Lint, PA-C  loratadine (CLARITIN) 10 MG tablet Take 1 tablet (10 mg total) by mouth daily. 06/30/22  Yes Nyoka Lint, PA-C  predniSONE (DELTASONE) 10 MG tablet Take 1 tablet (10 mg total) by mouth in the morning, at noon, and at bedtime. 06/30/22  Yes Nyoka Lint, PA-C  albuterol (PROVENTIL) (2.5 MG/3ML) 0.083% nebulizer solution  INHALE THE CONTENTS OF 1 VIAL VIA NEBULIZER EVERY 6 HOURS AS NEEDED FOR WHEEZING OR SHORTNESS OF BREATH 11/22/21   Gildardo Pounds, NP  budesonide-formoterol (SYMBICORT) 80-4.5 MCG/ACT inhaler Inhale 2 puffs into the lungs 2 (two) times daily. 05/26/22   Gildardo Pounds, NP  traZODone (DESYREL) 100 MG tablet Take 1 tablet (100 mg total) by mouth at bedtime. 05/26/22   Gildardo Pounds, NP  VENTOLIN HFA 108 (90 Base) MCG/ACT inhaler Inhale 1-2 puffs into the lungs every 6 (six) hours as needed for wheezing or shortness of breath. 05/26/22   Gildardo Pounds, NP    Family History Family History  Problem Relation Age of Onset   Hypertension Neg Hx     Social History Social History   Tobacco Use   Smoking status: Some Days    Packs/day: 0.50    Years: 7.00    Additional pack years: 0.00    Total pack years: 3.50    Types: Cigarettes    Last attempt to quit: 09/26/2019    Years since quitting: 2.7   Smokeless tobacco: Current  Vaping Use   Vaping Use: Never used  Substance Use Topics   Alcohol use: No   Drug use: No     Allergies   Patient has no known allergies.   Review of Systems Review of Systems  Skin:  Positive for rash (front and back).     Physical Exam Triage Vital Signs ED Triage Vitals [06/30/22 1428]  Enc Vitals Group     BP 125/78     Pulse Rate 84     Resp 16     Temp 97.8 F (36.6 C)     Temp Source Oral     SpO2 96 %     Weight      Height      Head Circumference      Peak Flow      Pain Score 0     Pain Loc      Pain Edu?      Excl. in [place]?    No data found.  Updated Vital Signs BP 125/78 (BP Location: Left Arm)   Pulse 84   Temp 97.8 F (36.6 C) (Oral)   Resp 16   SpO2 96%   Visual Acuity Right Eye Distance:   Left Eye Distance:   Bilateral Distance:    Right Eye Near:   Left Eye Near:    Bilateral Near:         Physical Exam Constitutional:      Appearance: Normal appearance.  HENT:     Mouth/Throat:     Mouth:  Mucous membranes are moist.     Pharynx: Oropharynx is clear. Uvula midline. No posterior oropharyngeal erythema.  Skin:    Comments: Skin: The rash covers the face, neck, upper extremities, anterior and posterior chest, pelvis and upper thighs bilaterally.  The lower legs are spared and are clear. The rash is diffusely scattered but is dense, papular areas that are oval in shape, slightly raised, fawn colored with mild scaling present.  There is no drainage from the areas. (Refer to pictures)  Neurological:     Mental Status: He is alert.      UC Treatments / Results  Labs (all labs ordered are listed, but only abnormal results are displayed) Labs Reviewed - No data to display  EKG   Radiology No results found.  Procedures Procedures (including critical care time)  Medications Ordered in UC Medications - No data to display  Initial Impression / Assessment and Plan / UC Course  I have reviewed the triage vital signs and the nursing notes.  Pertinent labs & imaging results that were available during my care of the patient were reviewed by me and considered in my medical decision making (see chart for details).    Plan: The diagnosis will be treated with the following: 1.  Itching: A.  Claritin 10 mg once daily to help control itching. B.  Hydroxyzine 10 mg 3 times a day to help control itching. 2.  Pityriasis rosea: A.  Prednisone 10 mg 3 times a day to try to reduce the inflammatory response. 3.  Internal referral made to: [facility], this is to have the patient evaluated to see if therapy needs to be changed to due to atypical presentation of pityriasis.  Final Clinical Impressions(s) / UC Diagnoses   Final diagnoses:  Itching  Pityriasis rosea     Discharge Instructions      Advised take Claritin 10 mg once daily to help reduce itching. Advised to take the hydroxyzine 10 mg every 8 hours to help reduce itching and irritation. Advised take prednisone  10 mg 3 times a day until completed to help reduce the inflammatory response.  Internal referral has been made to: [facility], their office should be calling  you within the next 48 hours to arrange appointment to be seen and evaluated.  Advised follow-up PCP return to urgent care as needed.    ED Prescriptions     Medication Sig Dispense Auth. Provider   loratadine (CLARITIN) 10 MG tablet Take 1 tablet (10 mg total) by mouth daily. 30 tablet Nyoka Lint, PA-C   hydrOXYzine (ATARAX) 10 MG tablet Take 1 tablet (10 mg total) by mouth 3 (three) times daily as needed. 30 tablet Nyoka Lint, PA-C   predniSONE (DELTASONE) 10 MG tablet Take 1 tablet (10 mg total) by mouth in the morning, at noon, and at bedtime. 15 tablet Nyoka Lint, PA-C      PDMP not reviewed this encounter.   Nyoka Lint, PA-C 06/30/22 1506

## 2022-06-30 NOTE — ED Triage Notes (Signed)
Pt c/o generalized rash onset > 2 weeks ago. States it is itchy. Lesions to bilat arms, legs, neck, back, trunk, groin.

## 2022-06-30 NOTE — Discharge Instructions (Signed)
Advised take Claritin 10 mg once daily to help reduce itching. Advised to take the hydroxyzine 10 mg every 8 hours to help reduce itching and irritation. Advised take prednisone 10 mg 3 times a day until completed to help reduce the inflammatory response.  Internal referral has been made to: [facility], their office should be calling you within the next 48 hours to arrange appointment to be seen and evaluated.  Advised follow-up PCP return to urgent care as needed.

## 2022-10-15 ENCOUNTER — Other Ambulatory Visit: Payer: Self-pay | Admitting: Nurse Practitioner

## 2022-10-15 DIAGNOSIS — J4541 Moderate persistent asthma with (acute) exacerbation: Secondary | ICD-10-CM

## 2022-10-15 NOTE — Telephone Encounter (Signed)
Copied from CRM [id number]. Topic: General - Other >> Oct 15, 2022  4:02 PM [name] wrote: Reason for CRM: Medication Refill - Medication: budesonide-formoterol (SYMBICORT) 80-4.5 MCG/ACT inhaler [[id number]]  albuterol (PROVENTIL) (2.5 MG/3ML) 0.083% nebulizer solution [[id number]]  Has the patient contacted their pharmacy? Yes.   (Agent: If no, request that the patient contact the pharmacy for the refill. If patient does not wish to contact the pharmacy document the reason why and proceed with request.) (Agent: If yes, when and what did the pharmacy advise?)  Preferred Pharmacy (with phone number or street name): [id number] [place], [place] - [address] AT [address] [place] [address] Phone: [phone] Fax: [phone] Hours: Not open 24 hours   Has the patient been seen for an appointment in the last year OR does the patient have an upcoming appointment? Yes.    Agent: Please be advised that RX refills may take up to 3 business days. We ask that you follow-up with your pharmacy.

## 2022-10-16 MED ORDER — VENTOLIN HFA 108 (90 BASE) MCG/ACT IN AERS: 1.0000 | INHALATION_SPRAY | Freq: Four times a day (QID) | RESPIRATORY_TRACT | 0 refills | Status: DC | PRN

## 2022-10-16 MED ORDER — BUDESONIDE-FORMOTEROL FUMARATE 80-4.5 MCG/ACT IN AERO: 2.0000 | INHALATION_SPRAY | Freq: Two times a day (BID) | RESPIRATORY_TRACT | 0 refills | Status: DC

## 2022-10-16 NOTE — Telephone Encounter (Signed)
Requested Prescriptions  Pending Prescriptions Disp Refills   budesonide-formoterol (SYMBICORT) 80-4.5 MCG/ACT inhaler 10.2 g 0    Sig: Inhale 2 puffs into the lungs 2 (two) times daily.     Pulmonology:  Combination Products Passed - 10/15/2022  4:18 PM      Passed - Valid encounter within last 12 months    Recent Outpatient Visits           4 months ago Moderate persistent asthma with acute exacerbation   [facility] [place], Shea Stakes, NP   10 months ago Encounter for annual physical exam   [facility] [place], Shea Stakes, NP   1 year ago Mild intermittent asthma without complication   [facility], [place] W, NP   3 years ago Moderate persistent asthma with acute exacerbation   [facility] [place], [place] W, NP   3 years ago Moderate persistent asthma with acute exacerbation   [facility] [facility] [place], Shea Stakes, NP       Future Appointments             In 1 month Claiborne Rigg, NP [facility]             VENTOLIN HFA 108 [phone] Base) MCG/ACT inhaler 18 g 0    Sig: Inhale 1-2 puffs into the lungs every 6 (six) hours as needed for wheezing or shortness of breath.     Pulmonology:  [employer] 2 Passed - 10/15/2022  4:18 PM      Passed - Last BP in normal range    BP Readings from Last 1 Encounters:  06/30/22 125/78         Passed - Last Heart Rate in normal range    Pulse Readings from Last 1 Encounters:  06/30/22 84         Passed - Valid encounter within last 12 months    Recent Outpatient Visits           4 months ago Moderate persistent asthma with acute exacerbation   [facility] [place], Shea Stakes, NP   10 months ago Encounter for annual physical exam   [facility] [place], NP    1 year ago Mild intermittent asthma without complication   [facility] [place], [place] W, NP   3 years ago Moderate persistent asthma with acute exacerbation   [place] [facility] [place], [place] W, NP   3 years ago Moderate persistent asthma with acute exacerbation   [place] [facility] Claiborne Rigg, NP       Future Appointments             In 1 month Claiborne Rigg, NP [facility]

## 2022-11-24 ENCOUNTER — Encounter: Payer: Self-pay | Admitting: Nurse Practitioner

## 2022-11-24 ENCOUNTER — Ambulatory Visit: Payer: Medicaid Other | Attending: Nurse Practitioner | Admitting: Nurse Practitioner

## 2022-11-24 VITALS — BP 114/80 | HR 99 | Ht 69.0 in | Wt 138.4 lb

## 2022-11-24 DIAGNOSIS — J4541 Moderate persistent asthma with (acute) exacerbation: Secondary | ICD-10-CM

## 2022-11-24 DIAGNOSIS — F909 Attention-deficit hyperactivity disorder, unspecified type: Secondary | ICD-10-CM

## 2022-11-24 DIAGNOSIS — F5101 Primary insomnia: Secondary | ICD-10-CM | POA: Diagnosis not present

## 2022-11-24 DIAGNOSIS — J4551 Severe persistent asthma with (acute) exacerbation: Secondary | ICD-10-CM | POA: Diagnosis not present

## 2022-11-24 DIAGNOSIS — Z23 Encounter for immunization: Secondary | ICD-10-CM | POA: Diagnosis not present

## 2022-11-24 MED ORDER — SYMBICORT 80-4.5 MCG/ACT IN AERO: 2.0000 | INHALATION_SPRAY | Freq: Two times a day (BID) | RESPIRATORY_TRACT | 1 refills | Status: DC

## 2022-11-24 MED ORDER — TRAZODONE HCL 100 MG PO TABS: 100.0000 mg | ORAL_TABLET | Freq: Every day | ORAL | 1 refills | Status: DC

## 2022-11-24 MED ORDER — VENTOLIN HFA 108 (90 BASE) MCG/ACT IN AERS
1.0000 | INHALATION_SPRAY | Freq: Four times a day (QID) | RESPIRATORY_TRACT | 1 refills | Status: DC | PRN
Start: 2022-11-24 — End: 2023-07-01

## 2022-11-24 NOTE — Progress Notes (Signed)
Assessment & Plan:  William May was seen today for insomnia and adhd.  Diagnoses and all orders for this visit:  Primary insomnia -     traZODone (DESYREL) 100 MG tablet; Take 1 tablet (100 mg total) by mouth at bedtime.  Moderate persistent asthma with acute exacerbation -     budesonide-formoterol (SYMBICORT) 80-4.5 MCG/ACT inhaler; Inhale 2 puffs into the lungs in the morning and at bedtime. -     VENTOLIN HFA 108 (90 Base) MCG/ACT inhaler; Inhale 1-2 puffs into the lungs every 6 (six) hours as needed for wheezing or shortness of breath.  Attention deficit hyperactivity disorder (ADHD), unspecified ADHD type -     Ambulatory referral to Psychiatry  Need for influenza vaccination -     Flu vaccine trivalent PF, 6mos and older(Flulaval,Afluria,Fluarix,Fluzone)  Severe persistent asthma with exacerbation Resolved   Patient has been counseled on age-appropriate routine health concerns for screening and prevention. These are reviewed and up-to-date. Referrals have been placed accordingly. Immunizations are up-to-date or declined.    Subjective:   Chief Complaint  Patient presents with   Insomnia   ADHD   HPI William May 37 y.o. male presents to office today for follow up to Asthma and requesting medication for ADHD.  He has a past medical history of Asthma.    Insomnia Notes insomnia for the past few years. Difficulty falling asleep and staying asleep.  I prescribed trazodone for him several months ago and he never picked this up from the pharmacy.   ADHD He states he was diagnosed with ADHD as a child and was prescribed ritalin at that time. He has not taken ritalin in many years but states he would like to restart a medication to help him focus and have better concentration.   Asthma Well controlled with symbicort daily use and prn use of SABA. Denies cough, wheezing or shortness of breath today.    Review of Systems  Constitutional:  Negative for fever,  malaise/fatigue and weight loss.  HENT: Negative.  Negative for nosebleeds.   Eyes: Negative.  Negative for blurred vision, double vision and photophobia.  Respiratory: Negative.  Negative for cough and shortness of breath.   Cardiovascular: Negative.  Negative for chest pain, palpitations and leg swelling.  Gastrointestinal: Negative.  Negative for heartburn, nausea and vomiting.  Musculoskeletal: Negative.  Negative for myalgias.  Neurological: Negative.  Negative for dizziness, focal weakness, seizures and headaches.  Psychiatric/Behavioral:  Negative for suicidal ideas. The patient has insomnia.     Past Medical History:  Diagnosis Date   Asthma     Past Surgical History:  Procedure Laterality Date   WISDOM TOOTH EXTRACTION      Family History  Problem Relation Age of Onset   Hypertension Neg Hx     Social History Reviewed with no changes to be made today.   Outpatient Medications Prior to Visit  Medication Sig Dispense Refill   albuterol (PROVENTIL) (2.5 MG/3ML) 0.083% nebulizer solution INHALE THE CONTENTS OF 1 VIAL VIA NEBULIZER EVERY 6 HOURS AS NEEDED FOR WHEEZING OR SHORTNESS OF BREATH 75 mL 1   hydrOXYzine (ATARAX) 10 MG tablet Take 1 tablet (10 mg total) by mouth 3 (three) times daily as needed. 30 tablet 0   loratadine (CLARITIN) 10 MG tablet Take 1 tablet (10 mg total) by mouth daily. 30 tablet 0   budesonide-formoterol (SYMBICORT) 80-4.5 MCG/ACT inhaler Inhale 2 puffs into the lungs 2 (two) times daily. 10.2 g 0   predniSONE (DELTASONE) 10  MG tablet Take 1 tablet (10 mg total) by mouth in the morning, at noon, and at bedtime. (Patient not taking: Reported on 11/24/2022) 15 tablet 0   traZODone (DESYREL) 100 MG tablet Take 1 tablet (100 mg total) by mouth at bedtime. (Patient not taking: Reported on 11/24/2022) 90 tablet 1   VENTOLIN HFA 108 (90 Base) MCG/ACT inhaler Inhale 1-2 puffs into the lungs every 6 (six) hours as needed for wheezing or shortness of breath. 18 g  0   No facility-administered medications prior to visit.    No Known Allergies     Objective:    BP 114/80 (BP Location: Left Arm, Patient Position: Sitting, Cuff Size: Normal)   Pulse 99   Ht 5\' 9"  (1.753 m)   Wt 138 lb 6.4 oz (62.8 kg)   SpO2 99%   BMI 20.44 kg/m  Wt Readings from Last 3 Encounters:  11/24/22 138 lb 6.4 oz (62.8 kg)  05/26/22 140 lb 6.4 oz (63.7 kg)  11/22/21 135 lb 6.4 oz (61.4 kg)    Physical Exam Vitals and nursing note reviewed.  Constitutional:      Appearance: He is well-developed.  HENT:     Head: Normocephalic and atraumatic.  Cardiovascular:     Rate and Rhythm: Normal rate and regular rhythm.     Heart sounds: Normal heart sounds. No murmur heard.    No friction rub. No gallop.  Pulmonary:     Effort: Pulmonary effort is normal. No tachypnea or respiratory distress.     Breath sounds: Normal breath sounds. No decreased breath sounds, wheezing, rhonchi or rales.  Chest:     Chest wall: No tenderness.  Abdominal:     General: Bowel sounds are normal.     Palpations: Abdomen is soft.  Musculoskeletal:        General: Normal range of motion.     Cervical back: Normal range of motion.  Skin:    General: Skin is warm and dry.  Neurological:     Mental Status: He is alert and oriented to person, place, and time.     Coordination: Coordination normal.  Psychiatric:        Behavior: Behavior is hyperactive. Behavior is cooperative.          Patient has been counseled extensively about nutrition and exercise as well as the importance of adherence with medications and regular follow-up. The patient was given clear instructions to go to ER or return to medical center if symptoms don't improve, worsen or new problems develop. The patient verbalized understanding.   Follow-up: Return if symptoms worsen or fail to improve.   Claiborne Rigg, FNP-BC [facility] and [facility] [place], [place] [phone]   11/24/2022,  12:39 PM

## 2023-07-01 ENCOUNTER — Other Ambulatory Visit: Payer: Self-pay

## 2023-07-01 ENCOUNTER — Telehealth: Payer: Self-pay | Admitting: *Deleted

## 2023-07-01 DIAGNOSIS — J4541 Moderate persistent asthma with (acute) exacerbation: Secondary | ICD-10-CM

## 2023-07-01 MED ORDER — VENTOLIN HFA 108 (90 BASE) MCG/ACT IN AERS: 1.0000 | INHALATION_SPRAY | Freq: Four times a day (QID) | RESPIRATORY_TRACT | 0 refills | Status: DC | PRN

## 2023-07-01 NOTE — Telephone Encounter (Signed)
 Refill sent.

## 2023-07-01 NOTE — Telephone Encounter (Signed)
 Copied from CRM [id number]. Topic: Clinical - Prescription Issue >> Jun 30, 2023  5:55 PM DeAngela L wrote: Reason for CRM: [facility] calling about the refill request sent to provider for VENTOLIN HFA 108 (90 Base) MCG/ACT inhaler They are waiting for the office to respond  [facility] Ginette Otto, [place] - [address] AT [address] [place] [address] Phone: [phone] Fax: [phone]

## 2023-08-17 ENCOUNTER — Ambulatory Visit: Payer: Self-pay

## 2023-08-17 NOTE — Telephone Encounter (Signed)
 Copied from CRM [id number]. Topic: Appointments - Appointment Scheduling >> Aug 17, 2023 11:50 AM Kevelyn M wrote: Patient is experiencing shortness of breath due to the pollen he believes. He needs a refill on inhalers and nebulizers.   Chief Complaint: SOB, wheezing. Is out of both inhalers, Symbicort , Albuterol . Asking to be worked in but needs refills today, completely out. Symptoms: above Frequency: 2 weeks Pertinent Negatives: Patient denies fever Disposition: [] ED /[] Urgent Care (no appt availability in office) / [] Appointment(In office/virtual)/ []  [facility]/ [] Home Care/ [] Refused Recommended Disposition /[] [place]/ [x]  Follow-up with PCP Additional Notes: Please advise pt.  Reason for Disposition  [1] MILD difficulty breathing (e.g., minimal/no SOB at rest, SOB with walking, pulse <100) AND [2] NEW-onset or WORSE than normal  Answer Assessment - Initial Assessment Questions 1. RESPIRATORY STATUS: "Describe your breathing?" (e.g., wheezing, shortness of breath, unable to speak, severe coughing)      SOB 2. ONSET: "When did this breathing problem begin?"      WEEKS 3. PATTERN "Does the difficult breathing come and go, or has it been constant since it started?"      Comes and goes 4. SEVERITY: "How bad is your breathing?" (e.g., mild, moderate, severe)    - MILD: No SOB at rest, mild SOB with walking, speaks normally in sentences, can lie down, no retractions, pulse < 100.    - MODERATE: SOB at rest, SOB with minimal exertion and prefers to sit, cannot lie down flat, speaks in phrases, mild retractions, audible wheezing, pulse 100-120.    - SEVERE: Very SOB at rest, speaks in single words, struggling to breathe, sitting hunched forward, retractions, pulse > 120      mild 5. RECURRENT SYMPTOM: "Have you had difficulty breathing before?" If Yes, ask: "When was the last time?" and "What happened that time?"      no 6. CARDIAC HISTORY: "Do you have any  history of heart disease?" (e.g., heart attack, angina, bypass surgery, angioplasty)      no 7. LUNG HISTORY: "Do you have any history of lung disease?"  (e.g., pulmonary embolus, asthma, emphysema)     ASthma 8. CAUSE: "What do you think is causing the breathing problem?"      Pollen 9. OTHER SYMPTOMS: "Do you have any other symptoms? (e.g., dizziness, runny nose, cough, chest pain, fever)     no 10. O2 SATURATION MONITOR:  "Do you use an oxygen saturation monitor (pulse oximeter) at home?" If Yes, ask: "What is your reading (oxygen level) today?" "What is your usual oxygen saturation reading?" (e.g., 95%)       no 11. PREGNANCY: "Is there any chance you are pregnant?" "When was your last menstrual period?"       N/a 12. TRAVEL: "Have you traveled out of the country in the last month?" (e.g., travel history, exposures)       no  Protocols used: Breathing Difficulty-A-AH

## 2023-08-17 NOTE — Telephone Encounter (Signed)
 Walk in scheduled at [facility] 08/18/2023

## 2023-08-18 ENCOUNTER — Ambulatory Visit: Attending: Family Medicine | Admitting: Family Medicine

## 2023-08-18 ENCOUNTER — Encounter: Payer: Self-pay | Admitting: Family Medicine

## 2023-08-18 VITALS — BP 122/73 | HR 79 | Ht 69.0 in | Wt 140.0 lb

## 2023-08-18 DIAGNOSIS — F5101 Primary insomnia: Secondary | ICD-10-CM

## 2023-08-18 DIAGNOSIS — J4541 Moderate persistent asthma with (acute) exacerbation: Secondary | ICD-10-CM | POA: Diagnosis not present

## 2023-08-18 DIAGNOSIS — Z13228 Encounter for screening for other metabolic disorders: Secondary | ICD-10-CM | POA: Diagnosis not present

## 2023-08-18 MED ORDER — TRAZODONE HCL 100 MG PO TABS
100.0000 mg | ORAL_TABLET | Freq: Every day | ORAL | 1 refills | Status: DC
Start: 2023-08-18 — End: 2023-11-18

## 2023-08-18 MED ORDER — BUDESONIDE-FORMOTEROL FUMARATE 80-4.5 MCG/ACT IN AERO
2.0000 | INHALATION_SPRAY | Freq: Two times a day (BID) | RESPIRATORY_TRACT | 1 refills | Status: DC
Start: 2023-08-18 — End: 2023-11-12

## 2023-08-18 MED ORDER — VENTOLIN HFA 108 (90 BASE) MCG/ACT IN AERS: 1.0000 | INHALATION_SPRAY | Freq: Four times a day (QID) | RESPIRATORY_TRACT | 0 refills | Status: AC | PRN

## 2023-08-18 MED ORDER — ALBUTEROL SULFATE (2.5 MG/3ML) 0.083% IN NEBU
INHALATION_SOLUTION | RESPIRATORY_TRACT | 1 refills | Status: DC
Start: 2023-08-18 — End: 2023-11-18

## 2023-08-18 NOTE — Patient Instructions (Signed)
 VISIT SUMMARY:  You came in today for refills of your asthma and insomnia medications. You have been experiencing worsened asthma symptoms due to pollen exposure and lack of inhalers. You also need a refill for your insomnia medication.  YOUR PLAN:  -ASTHMA: Asthma is a condition where your airways narrow and swell, producing extra mucus, which can make breathing difficult. Your asthma symptoms have worsened due to pollen exposure and not having your inhalers. We have refilled your albuterol  solution for the nebulizer, Ventolin  inhaler for use as needed, and Symbicort  inhaler.  -INSOMNIA: Insomnia is a sleep disorder where you have trouble falling or staying asleep. You have not been taking your trazodone  recently, which has affected your sleep. We have refilled your trazodone  prescription.  -GENERAL HEALTH MAINTENANCE: We have not done recent blood work to check your kidney function, liver function, or cholesterol levels. We will order these tests to ensure everything is functioning properly.  INSTRUCTIONS:  Please follow up with the lab to complete your blood work for kidney function, liver function, and cholesterol levels. Continue using your inhalers as prescribed and take your trazodone  for sleep as directed.

## 2023-08-18 NOTE — Progress Notes (Signed)
 Subjective:  Patient ID: William May, male    DOB: 1985/09/12  Age: 38 y.o. MRN: [id number]  CC: Medical Management of Chronic Issues (No concerns)     Discussed the use of AI scribe software for clinical note transcription with the patient, who gave verbal consent to proceed.  History of Present Illness William May is a 38 year old male with asthma and insomnia who presents for medication refills.  He requires refills for Ventolin , albuterol , and Symbicort  inhalers. He has been without these inhalers since last month, resulting in exacerbated asthma symptoms due to pollen exposure. Symptoms include shortness of breath, wheezing, and nocturnal coughing. He does not use loratadine  for allergies.  He also needs a refill for trazodone  to manage insomnia.    Past Medical History:  Diagnosis Date   Asthma     Past Surgical History:  Procedure Laterality Date   WISDOM TOOTH EXTRACTION      Family History  Problem Relation Age of Onset   Hypertension Neg Hx     Social History   Socioeconomic History   Marital status: Single    Spouse name: Not on file   Number of children: Not on file   Years of education: Not on file   Highest education level: Not on file  Occupational History   Not on file  Tobacco Use   Smoking status: Some Days    Current packs/day: 0.00    Average packs/day: 0.5 packs/day for 7.0 years (3.5 ttl pk-yrs)    Types: Cigarettes    Start date: 09/25/2012    Last attempt to quit: 09/26/2019    Years since quitting: 3.8   Smokeless tobacco: Current  Vaping Use   Vaping status: Never Used  Substance and Sexual Activity   Alcohol use: No   Drug use: No   Sexual activity: Not Currently  Other Topics Concern   Not on file  Social History Narrative   Not on file   Social Drivers of Health   Financial Resource Strain: Not on file  Food Insecurity: Not on file  Transportation Needs: Not on file  Physical Activity: Not on file   Stress: Not on file  Social Connections: Not on file    No Known Allergies  Outpatient Medications Prior to Visit  Medication Sig Dispense Refill   hydrOXYzine  (ATARAX ) 10 MG tablet Take 1 tablet (10 mg total) by mouth 3 (three) times daily as needed. 30 tablet 0   loratadine  (CLARITIN ) 10 MG tablet Take 1 tablet (10 mg total) by mouth daily. 30 tablet 0   albuterol  (PROVENTIL ) (2.5 MG/3ML) 0.083% nebulizer solution INHALE THE CONTENTS OF 1 VIAL VIA NEBULIZER EVERY 6 HOURS AS NEEDED FOR WHEEZING OR SHORTNESS OF BREATH 75 mL 1   budesonide -formoterol  (SYMBICORT ) 80-4.5 MCG/ACT inhaler Inhale 2 puffs into the lungs in the morning and at bedtime. 10.2 g 1   traZODone  (DESYREL ) 100 MG tablet Take 1 tablet (100 mg total) by mouth at bedtime. 90 tablet 1   VENTOLIN  HFA 108 (90 Base) MCG/ACT inhaler Inhale 1-2 puffs into the lungs every 6 (six) hours as needed for wheezing or shortness of breath. 18 g 0   No facility-administered medications prior to visit.     ROS Review of Systems  Constitutional:  Negative for activity change and appetite change.  HENT:  Negative for sinus pressure and sore throat.   Respiratory:  Positive for shortness of breath and wheezing. Negative for chest tightness.   Cardiovascular:  Negative for chest pain and palpitations.  Gastrointestinal:  Negative for abdominal distention, abdominal pain and constipation.  Genitourinary: Negative.   Musculoskeletal: Negative.   Psychiatric/Behavioral:  Negative for behavioral problems and dysphoric mood.     Objective:  BP 122/73   Pulse 79   Ht 5\' 9"  (1.753 m)   Wt 140 lb (63.5 kg)   SpO2 94%   BMI 20.67 kg/m      08/18/2023    8:54 AM 11/24/2022   11:14 AM 06/30/2022    2:28 PM  BP/Weight  Systolic BP 122 114 125  Diastolic BP 73 80 78  Wt. (Lbs) 140 138.4   BMI 20.67 kg/m2 20.44 kg/m2       Physical Exam Constitutional:      Appearance: He is well-developed.  Cardiovascular:     Rate and Rhythm:  Normal rate.     Heart sounds: Normal heart sounds. No murmur heard. Pulmonary:     Effort: Pulmonary effort is normal.     Breath sounds: Normal breath sounds. No wheezing or rales.  Chest:     Chest wall: No tenderness.  Abdominal:     General: Bowel sounds are normal. There is no distension.     Palpations: Abdomen is soft. There is no mass.     Tenderness: There is no abdominal tenderness.  Musculoskeletal:        General: Normal range of motion.     Right lower leg: No edema.     Left lower leg: No edema.  Neurological:     Mental Status: He is alert and oriented to person, place, and time.  Psychiatric:        Mood and Affect: Mood normal.        Latest Ref Rng & Units 05/26/2022   10:56 AM 11/22/2021   10:52 AM 11/08/2020    8:54 AM  CMP  Glucose 70 - 99 mg/dL 90  95  92   BUN 6 - 20 mg/dL 11  6  10    Creatinine 0.76 - 1.27 mg/dL 1.61  0.96  0.45   Sodium 134 - 144 mmol/L 141  143  140   Potassium 3.5 - 5.2 mmol/L 4.6  4.9  4.5   Chloride 96 - 106 mmol/L 103  104  104   CO2 20 - 29 mmol/L 21  24  23    Calcium 8.7 - 10.2 mg/dL 9.6  9.6  9.1   Total Protein 6.0 - 8.5 g/dL 6.2  6.6  6.1   Total Bilirubin 0.0 - 1.2 mg/dL 0.3  0.3  0.3   Alkaline Phos 44 - 121 IU/L 75  80  70   AST 0 - 40 IU/L 14  14  15    ALT 0 - 44 IU/L 14  7  9      Lipid Panel     Component Value Date/Time   CHOL 130 11/22/2021 1052   TRIG 55 11/22/2021 1052   HDL 37 (L) 11/22/2021 1052   CHOLHDL 3.5 11/22/2021 1052   LDLCALC 81 11/22/2021 1052    CBC    Component Value Date/Time   WBC 4.9 11/22/2021 1052   WBC 15.2 (H) 10/23/2012 1305   RBC 4.47 11/22/2021 1052   RBC 5.03 10/23/2012 1305   HGB 14.4 11/22/2021 1052   HCT 42.1 11/22/2021 1052   PLT 380 11/22/2021 1052   MCV 94 11/22/2021 1052   MCH 32.2 11/22/2021 1052   MCH 33.6 10/23/2012 1305   MCHC  34.2 11/22/2021 1052   MCHC 36.3 (H) 10/23/2012 1305   RDW 11.7 11/22/2021 1052   LYMPHSABS 1.8 11/22/2021 1052   MONOABS 0.8  10/23/2012 1305   EOSABS 0.3 11/22/2021 1052   BASOSABS 0.1 11/22/2021 1052    No results found for: "HGBA1C"    1. Moderate persistent asthma with acute exacerbation Recent exacerbation due to running out of medications which I have refilled - albuterol  (PROVENTIL ) (2.5 MG/3ML) 0.083% nebulizer solution; INHALE THE CONTENTS OF 1 VIAL VIA NEBULIZER EVERY 6 HOURS AS NEEDED FOR WHEEZING OR SHORTNESS OF BREATH  Dispense: 75 mL; Refill: 1 - budesonide -formoterol  (SYMBICORT ) 80-4.5 MCG/ACT inhaler; Inhale 2 puffs into the lungs in the morning and at bedtime.  Dispense: 10.2 g; Refill: 1 - VENTOLIN  HFA 108 (90 Base) MCG/ACT inhaler; Inhale 1-2 puffs into the lungs every 6 (six) hours as needed for wheezing or shortness of breath.  Dispense: 18 g; Refill: 0  2. Primary insomnia Stable - traZODone  (DESYREL ) 100 MG tablet; Take 1 tablet (100 mg total) by mouth at bedtime.  Dispense: 90 tablet; Refill: 1  3. Screening for metabolic disorder (Primary) - [id number] - LP+Non-HDL Cholesterol   Meds ordered this encounter  Medications   albuterol  (PROVENTIL ) (2.5 MG/3ML) 0.083% nebulizer solution    Sig: INHALE THE CONTENTS OF 1 VIAL VIA NEBULIZER EVERY 6 HOURS AS NEEDED FOR WHEEZING OR SHORTNESS OF BREATH    Dispense:  75 mL    Refill:  1   budesonide -formoterol  (SYMBICORT ) 80-4.5 MCG/ACT inhaler    Sig: Inhale 2 puffs into the lungs in the morning and at bedtime.    Dispense:  10.2 g    Refill:  1   VENTOLIN  HFA 108 (90 Base) MCG/ACT inhaler    Sig: Inhale 1-2 puffs into the lungs every 6 (six) hours as needed for wheezing or shortness of breath.    Dispense:  18 g    Refill:  0   traZODone  (DESYREL ) 100 MG tablet    Sig: Take 1 tablet (100 mg total) by mouth at bedtime.    Dispense:  90 tablet    Refill:  1    Follow-up: Return in about 3 months (around 11/18/2023) for Medical conditions with PCP.       Joaquin Mulberry, MD, FAAFP. [facility]  [place], [place] [phone]   08/18/2023, 9:04 AM

## 2023-08-19 ENCOUNTER — Ambulatory Visit: Payer: Self-pay | Admitting: Family Medicine

## 2023-08-19 LAB — CMP14+EGFR
ALT: 10 IU/L (ref 0–44)
AST: 15 IU/L (ref 0–40)
Albumin: 4.4 g/dL (ref 4.1–5.1)
Alkaline Phosphatase: 81 IU/L (ref 44–121)
BUN/Creatinine Ratio: 12 (ref 9–20)
BUN: 11 mg/dL (ref 6–20)
Bilirubin Total: 0.6 mg/dL (ref 0.0–1.2)
CO2: 21 mmol/L (ref 20–29)
Calcium: 9.6 mg/dL (ref 8.7–10.2)
Chloride: 107 mmol/L — ABNORMAL HIGH (ref 96–106)
Creatinine, Ser: 0.92 mg/dL (ref 0.76–1.27)
Globulin, Total: 1.8 g/dL (ref 1.5–4.5)
Glucose: 119 mg/dL — ABNORMAL HIGH (ref 70–99)
Potassium: 4.6 mmol/L (ref 3.5–5.2)
Sodium: 142 mmol/L (ref 134–144)
Total Protein: 6.2 g/dL (ref 6.0–8.5)
eGFR: 110 mL/min/{1.73_m2} (ref >59)

## 2023-08-19 LAB — LP+NON-HDL CHOLESTEROL
Cholesterol, Total: 146 mg/dL (ref 100–199)
HDL: 33 mg/dL — ABNORMAL LOW (ref >39)
LDL Chol Calc (NIH): 77 mg/dL (ref 0–99)
Total Non-HDL-Chol (LDL+VLDL): 113 mg/dL (ref 0–129)
Triglycerides: 212 mg/dL — ABNORMAL HIGH (ref 0–149)
VLDL Cholesterol Cal: 36 mg/dL (ref 5–40)

## 2023-11-11 ENCOUNTER — Other Ambulatory Visit: Payer: Self-pay | Admitting: Family Medicine

## 2023-11-11 DIAGNOSIS — J4541 Moderate persistent asthma with (acute) exacerbation: Secondary | ICD-10-CM

## 2023-11-18 ENCOUNTER — Ambulatory Visit: Attending: Nurse Practitioner | Admitting: Nurse Practitioner

## 2023-11-18 ENCOUNTER — Encounter: Payer: Self-pay | Admitting: Nurse Practitioner

## 2023-11-18 VITALS — BP 107/68 | HR 69 | Resp 20 | Ht 69.0 in | Wt 136.4 lb

## 2023-11-18 DIAGNOSIS — F909 Attention-deficit hyperactivity disorder, unspecified type: Secondary | ICD-10-CM | POA: Diagnosis not present

## 2023-11-18 DIAGNOSIS — F5101 Primary insomnia: Secondary | ICD-10-CM | POA: Diagnosis not present

## 2023-11-18 DIAGNOSIS — J4541 Moderate persistent asthma with (acute) exacerbation: Secondary | ICD-10-CM | POA: Diagnosis not present

## 2023-11-18 MED ORDER — TRAZODONE HCL 100 MG PO TABS
100.0000 mg | ORAL_TABLET | Freq: Every day | ORAL | 1 refills | Status: AC
Start: 2023-11-18 — End: ?

## 2023-11-18 MED ORDER — ALBUTEROL SULFATE (2.5 MG/3ML) 0.083% IN NEBU: INHALATION_SOLUTION | RESPIRATORY_TRACT | 1 refills | Status: AC

## 2023-11-18 NOTE — Progress Notes (Signed)
Referral for therapy

## 2023-11-18 NOTE — Progress Notes (Signed)
 Assessment & Plan:  Darrill was seen today for referral.  Diagnoses and all orders for this visit:  Attention deficit hyperactivity disorder (ADHD), unspecified ADHD type -     Ambulatory referral to Psychiatry  Primary insomnia -     traZODone  (DESYREL ) 100 MG tablet; Take 1 tablet (100 mg total) by mouth at bedtime. May increase to 2 tablets by mouth at bedtime if needed  Moderate persistent asthma with acute exacerbation Symptoms well controlled. He does continue to smoke. Not ready to quit.  -     albuterol  (PROVENTIL ) (2.5 MG/3ML) 0.083% nebulizer solution; INHALE THE CONTENTS OF 1 VIAL VIA NEBULIZER EVERY 6 HOURS AS NEEDED FOR WHEEZING OR SHORTNESS OF BREATH    Patient has been counseled on age-appropriate routine health concerns for screening and prevention. These are reviewed and up-to-date. Referrals have been placed accordingly. Immunizations are up-to-date or declined.    Subjective:   Chief Complaint  Patient presents with   Referral    William May 38 y.o. male presents to office today for follow up to Asthma and requesting medication for ADHD. He is accompanied by his mother today who states she is concerned that he is not sleeping and he is depressed   He has a past medical history of Asthma.    ADHD He states he was diagnosed with ADHD as a child and was prescribed ritalin at that time. He has not taken ritalin in many years but states he would like to restart a medication to help him focus and have better concentration.   Insomnia Notes insomnia for the past few years. Difficulty falling asleep and staying asleep.  I prescribed trazodone  for him several months ago and he never picked this up from the pharmacy.     Review of Systems  Constitutional:  Negative for fever, malaise/fatigue and weight loss.  HENT: Negative.  Negative for nosebleeds.   Eyes: Negative.  Negative for blurred vision, double vision and photophobia.  Respiratory: Negative.   Negative for cough and shortness of breath.   Cardiovascular: Negative.  Negative for chest pain, palpitations and leg swelling.  Gastrointestinal: Negative.  Negative for heartburn, nausea and vomiting.  Musculoskeletal: Negative.  Negative for myalgias.  Neurological: Negative.  Negative for dizziness, focal weakness, seizures and headaches.  Psychiatric/Behavioral:  Positive for memory loss. Negative for suicidal ideas. The patient has insomnia.     Past Medical History:  Diagnosis Date   Asthma     Past Surgical History:  Procedure Laterality Date   WISDOM TOOTH EXTRACTION      Family History  Problem Relation Age of Onset   Hypertension Neg Hx     Social History Reviewed with no changes to be made today.   Outpatient Medications Prior to Visit  Medication Sig Dispense Refill   SYMBICORT  80-4.5 MCG/ACT inhaler INHALE 2 PUFFS INTO THE LUNGS IN THE MORNING AND AT BEDTIME 10.2 g 1   VENTOLIN  HFA 108 (90 Base) MCG/ACT inhaler Inhale 1-2 puffs into the lungs every 6 (six) hours as needed for wheezing or shortness of breath. 18 g 0   albuterol  (PROVENTIL ) (2.5 MG/3ML) 0.083% nebulizer solution INHALE THE CONTENTS OF 1 VIAL VIA NEBULIZER EVERY 6 HOURS AS NEEDED FOR WHEEZING OR SHORTNESS OF BREATH (Patient not taking: Reported on 11/18/2023) 75 mL 1   hydrOXYzine  (ATARAX ) 10 MG tablet Take 1 tablet (10 mg total) by mouth 3 (three) times daily as needed. (Patient not taking: Reported on 11/18/2023) 30 tablet 0  loratadine  (CLARITIN ) 10 MG tablet Take 1 tablet (10 mg total) by mouth daily. (Patient not taking: Reported on 11/18/2023) 30 tablet 0   traZODone  (DESYREL ) 100 MG tablet Take 1 tablet (100 mg total) by mouth at bedtime. (Patient not taking: Reported on 11/18/2023) 90 tablet 1   No facility-administered medications prior to visit.    No Known Allergies     Objective:    BP 107/68 (BP Location: Right Arm, Patient Position: Sitting, Cuff Size: Normal)   Pulse 69   Resp 20    Ht 5' 9 (1.753 m)   Wt 136 lb 6.4 oz (61.9 kg)   SpO2 100%   BMI 20.14 kg/m  Wt Readings from Last 3 Encounters:  11/18/23 136 lb 6.4 oz (61.9 kg)  08/18/23 140 lb (63.5 kg)  11/24/22 138 lb 6.4 oz (62.8 kg)    Physical Exam Vitals and nursing note reviewed.  Constitutional:      Appearance: He is well-developed.  HENT:     Head: Normocephalic and atraumatic.  Cardiovascular:     Rate and Rhythm: Normal rate and regular rhythm.     Heart sounds: Normal heart sounds. No murmur heard.    No friction rub. No gallop.  Pulmonary:     Effort: Pulmonary effort is normal. No tachypnea or respiratory distress.     Breath sounds: Normal breath sounds. No decreased breath sounds, wheezing, rhonchi or rales.  Chest:     Chest wall: No tenderness.  Abdominal:     General: Bowel sounds are normal.     Palpations: Abdomen is soft.  Musculoskeletal:        General: Normal range of motion.     Cervical back: Normal range of motion.  Skin:    General: Skin is warm and dry.  Neurological:     Mental Status: He is alert and oriented to person, place, and time.     Coordination: Coordination normal.  Psychiatric:        Behavior: Behavior normal. Behavior is cooperative.        Thought Content: Thought content normal.        Judgment: Judgment normal.          Patient has been counseled extensively about nutrition and exercise as well as the importance of adherence with medications and regular follow-up. The patient was given clear instructions to go to ER or return to medical center if symptoms don't improve, worsen or new problems develop. The patient verbalized understanding.   Follow-up: Return if symptoms worsen or fail to improve.   Haze LELON Servant, FNP-BC [facility] [place], [place] [phone]   11/18/2023, 1:58 PM

## 2023-12-11 ENCOUNTER — Ambulatory Visit: Admitting: Nurse Practitioner

## 2024-01-31 ENCOUNTER — Other Ambulatory Visit: Payer: Self-pay | Admitting: Nurse Practitioner

## 2024-01-31 DIAGNOSIS — J4541 Moderate persistent asthma with (acute) exacerbation: Secondary | ICD-10-CM

## 2024-02-02 NOTE — Telephone Encounter (Signed)
 Requested Prescriptions  Pending Prescriptions Disp Refills   SYMBICORT  80-4.5 MCG/ACT inhaler [Pharmacy Med Name: SYMBICORT  80/4. (120  ORAL INH)] 10.2 g 1    Sig: INHALE 2 PUFFS INTO THE LUNGS IN THE MORNING AND AT BEDTIME     Pulmonology:  Combination Products Passed - 02/02/2024 11:18 AM      Passed - Valid encounter within last 12 months    Recent Outpatient Visits           2 months ago Attention deficit hyperactivity disorder (ADHD), unspecified ADHD type   [facility] - A Dept Of [place]. [facility] Theotis Haze ORN, NP   5 months ago Screening for metabolic disorder   [facility] - A Dept Of [place]. [facility] Delbert Clam, MD   1 year ago Primary insomnia   [facility] - A Dept Of [place]. [facility] Theotis Haze ORN, NP   1 year ago Moderate persistent asthma with acute exacerbation   [facility] [place] - A Dept Of [place]. [facility] Theotis Haze ORN, NP   2 years ago Encounter for annual physical exam   [facility] [place] - A Dept Of . [facility] Theotis Haze ORN, [place]

## 2024-02-08 ENCOUNTER — Ambulatory Visit (INDEPENDENT_AMBULATORY_CARE_PROVIDER_SITE_OTHER): Payer: Self-pay | Admitting: Clinical

## 2024-02-08 DIAGNOSIS — F331 Major depressive disorder, recurrent, moderate: Secondary | ICD-10-CM | POA: Diagnosis not present

## 2024-02-08 DIAGNOSIS — F1410 Cocaine abuse, uncomplicated: Secondary | ICD-10-CM

## 2024-02-08 DIAGNOSIS — F4381 Prolonged grief disorder: Secondary | ICD-10-CM

## 2024-02-08 DIAGNOSIS — F1290 Cannabis use, unspecified, uncomplicated: Secondary | ICD-10-CM

## 2024-02-10 NOTE — Progress Notes (Deleted)
 Psychiatric Initial Adult Assessment   Patient Identification: William May MRN:  [id number] Date of Evaluation:  02/10/2024 Referral Source: *** Chief Complaint:  No chief complaint on file.  Visit Diagnosis: No diagnosis found.   Assessment:  William May is a 38 y.o. male with a history of *** who presents in person to [facility] for initial evaluation on 02/10/2024.    At initial evaluation patient reports ***  A number of assessments were performed during the evaluation today including  PHQ-9 which they scored a *** on, GAD-7 which they scored a *** on, and [place] suicide severity screening which showed ***.    Risk Assessment: A suicide and violence risk assessment was performed as part of this evaluation. There patient is deemed to be at chronic elevated risk for self-harm/suicide given the following factors: {SABSUICIDERISKFACTORS:29780}. These risk factors are mitigated by the following factors: lack of active SI/HI, no known access to weapons or firearms, no history of previous suicide attempts, no history of violence, and motivation for treatment. The patient is deemed to be at chronic elevated risk for violence given the following factors: N/A. These risk factors are mitigated by the following factors: no known history of violence towards others, no known violence towards others in the last 6 months, no known history of threats of harm towards others, no known homicidal ideation in the last 6 months, no command hallucinations to harm others in the last 6 months, no active symptoms of psychosis, and no active symptoms of mania. There is no acute risk for suicide or violence at this time. The patient was educated about relevant modifiable risk factors including following recommendations for treatment of psychiatric illness and abstaining from substance abuse.  While future psychiatric events cannot be accurately predicted, the patient  does not currently require  acute inpatient psychiatric care and does not currently meet [place]  involuntary commitment criteria.  Patient was given contact information for crisis resources, behavioral health clinic and was instructed to call 911 for emergencies.    Plan: # *** Past medication trials:  Status of problem: *** Interventions: -- ***  # *** Past medication trials:  Status of problem: *** Interventions: -- ***  # *** Past medication trials:  Status of problem: *** Interventions: -- ***   History of Present Illness:  ***  Associated Signs/Symptoms: Depression Symptoms:  {DEPRESSION SYMPTOMS:20000} (Hypo) Manic Symptoms:  {BHH MANIC SYMPTOMS:22872} Anxiety Symptoms:  {BHH ANXIETY SYMPTOMS:22873} Psychotic Symptoms:  {BHH PSYCHOTIC SYMPTOMS:22874} PTSD Symptoms: {BHH PTSD SYMPTOMS:22875}  Past Psychiatric History:  Past psychiatric diagnoses: *** Psychiatric hospitalizations:*** Past suicide attempts: *** Hx of self harm: *** Hx of violence towards others: *** Prior psychiatric providers: *** Prior therapy: *** Access to firearms: ***  Prior medication trials: ***  Substance use: ***  Past Medical History:  Past Medical History:  Diagnosis Date   Asthma     Past Surgical History:  Procedure Laterality Date   WISDOM TOOTH EXTRACTION      Family Psychiatric History: ***  Family History:  Family History  Problem Relation Age of Onset   Hypertension Neg Hx     Social History:   Social History   Socioeconomic History   Marital status: Single    Spouse name: Not on file   Number of children: Not on file   Years of education: Not on file   Highest education level: Not on file  Occupational History   Not on file  Tobacco Use  Smoking status: Some Days    Current packs/day: 0.00    Average packs/day: 0.5 packs/day for 7.0 years (3.5 ttl pk-yrs)    Types: Cigarettes    Start date: 09/25/2012    Last attempt to quit: 09/26/2019     Years since quitting: 4.3   Smokeless tobacco: Current  Vaping Use   Vaping status: Never Used  Substance and Sexual Activity   Alcohol use: No   Drug use: No   Sexual activity: Not Currently  Other Topics Concern   Not on file  Social History Narrative   Not on file   Social Drivers of Health   Financial Resource Strain: Not on file  Food Insecurity: Not on file  Transportation Needs: Not on file  Physical Activity: Not on file  Stress: Not on file  Social Connections: Not on file    Additional Social History: ***  Allergies:  No Known Allergies  Metabolic Disorder Labs: No results found for: HGBA1C, MPG No results found for: PROLACTIN Lab Results  Component Value Date   CHOL 146 08/18/2023   TRIG 212 (H) 08/18/2023   HDL 33 (L) 08/18/2023   CHOLHDL 3.5 11/22/2021   LDLCALC 77 08/18/2023   LDLCALC 81 11/22/2021   Lab Results  Component Value Date   TSH 1.890 10/09/2016    Therapeutic Level Labs: No results found for: LITHIUM No results found for: CBMZ No results found for: VALPROATE  Current Medications: Current Outpatient Medications  Medication Sig Dispense Refill   albuterol  (PROVENTIL ) (2.5 MG/3ML) 0.083% nebulizer solution INHALE THE CONTENTS OF 1 VIAL VIA NEBULIZER EVERY 6 HOURS AS NEEDED FOR WHEEZING OR SHORTNESS OF BREATH 75 mL 1   SYMBICORT  80-4.5 MCG/ACT inhaler INHALE 2 PUFFS INTO THE LUNGS IN THE MORNING AND AT BEDTIME 10.2 g 1   traZODone  (DESYREL ) 100 MG tablet Take 1 tablet (100 mg total) by mouth at bedtime. May increase to 2 tablets by mouth at bedtime if needed 90 tablet 1   VENTOLIN  HFA 108 (90 Base) MCG/ACT inhaler Inhale 1-2 puffs into the lungs every 6 (six) hours as needed for wheezing or shortness of breath. 18 g 0   No current facility-administered medications for this visit.    Musculoskeletal: Strength & Muscle Tone: within normal limits Gait & Station: normal Patient leans: N/A  Psychiatric Specialty  Exam:  Psychiatric Specialty Exam: There were no vitals taken for this visit.There is no height or weight on file to calculate BMI. Review of Systems  General Appearance: Casual and Fairly Groomed  Eye Contact:  Good  Speech:  Clear and Coherent and Normal Rate  Volume:  Normal  Mood:  Euthymic  Affect:  Appropriate  Thought Content: Logical   Suicidal Thoughts:  No  Homicidal Thoughts:  No  Thought Process:  Coherent  Orientation:  Full (Time, Place, and Person)    Memory: Immediate;   Good Recent;   Good Remote;   Good  Judgment:  Fair  Insight:  Fair  Concentration:  Concentration: Good and Attention Span: Good  Recall:  not formally assessed   Fund of Knowledge: Good  Language: Good  Psychomotor Activity:  Normal  Akathisia:  No  AIMS (if indicated): not done  Assets:  Communication Skills Desire for Improvement Financial Resources/Insurance Housing Intimacy Leisure Time Social Support Transportation  ADL's:  Intact  Cognition: WNL  Sleep:  Good    Screenings: GAD-7    Advertising Copywriter from 02/08/2024 in [facility] Office Visit  from 11/18/2023 in [facility] [place] - A Dept Of [place]. [facility] Office Visit from 08/18/2023 in [facility] [place] - A Dept Of [place]. [facility] Office Visit from 11/24/2022 in [facility] - A Dept Of [place]. [facility] Office Visit from 05/26/2022 in [facility] [place] - A Dept Of [place]. [facility]  Total GAD-7 Score 4 0 0 2 0   PHQ2-9    Flowsheet Row Counselor from 02/08/2024 in [facility] Office Visit from 11/18/2023 in [facility] [place] - A Dept Of [place]. [facility] Office Visit from 08/18/2023 in [facility] [place] - A Dept Of [place]. [facility] Office Visit from 11/24/2022 in [facility]  [place] - A Dept Of [place]. [facility] Office Visit from 05/26/2022 in [facility] [place] - A Dept Of [place]. [facility]  PHQ-2 Total Score 4 1 3  0 2  PHQ-9 Total Score 15 2 4 2 7    Flowsheet Row UC from 06/30/2022 in [facility] at [facility])  C-SSRS RISK CATEGORY No Risk     Collaboration of Care: Medication Management AEB ***  Patient/Guardian was advised Release of Information must be obtained prior to any record release in order to collaborate their care with an outside provider. Patient/Guardian was advised if they have not already done so to contact the registration department to sign all necessary forms in order for us  to release information regarding their care.   Consent: Patient/Guardian gives verbal consent for treatment and assignment of benefits for services provided during this visit. Patient/Guardian expressed understanding and agreed to proceed.   Lylia Karn, MD 11/12/20259:25 AM

## 2024-02-13 NOTE — Progress Notes (Signed)
 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, mild Cannabis use disorder     02/08/2024   11:21 AM 11/18/2023    1:43 PM 08/18/2023    8:55 AM 11/24/2022   11:15 AM  GAD 7 : Generalized Anxiety Score  Nervous, Anxious, on Edge 1 0 0 0  Control/stop worrying 0 0 0 0  Worry too much - different things 0 0 0 0  Trouble relaxing 1 0 0 1  Restless 1 0 0 1  Easily annoyed or irritable 1 0 0 0  Afraid - awful might happen 0 0 0 0  Total GAD 7 Score 4 0 0 2  Anxiety Difficulty Somewhat difficult Not difficult at all       Flowsheet Row Counselor from 02/08/2024 in [facility]  PHQ-9 Total Score 15     Tx Recommendations: individual counseling with a dual licensed substance use and mental health counselor via [facility]    CCA Biopsychosocial Intake/Chief Complaint:  client reported he is presenting by his own referral. client reported he needs to talk to somebody to let out his frustrations. client reported I am spiraling out of control. client reported he had a therapist with private practice some years ago. client reported he is not taking any medications for anxiety or depression.  Current Symptoms/Problems: client reported irritability, mood swings, and substance use (crack/ marijuana)  Patient Reported Schizophrenia/Schizoaffective Diagnosis in Past: No  Strengths: voluntarily engaging in services  Preferences: counseling  Abilities: engage in treatment planning  Type of Services Patient Feels are Needed: therapy  Initial Clinical Notes/Concerns: client reported no history of being hospitalized for mental health. client reported his mental health and substance use started after his  dad passed away 4 years ago. client reported seeing his father in a casket is a image that has stayed in his thoughts.   Mental Health Symptoms Depression:  Change in energy/activity; Hopelessness   Duration of Depressive symptoms: Greater than two weeks   Mania:  None   Anxiety:   Irritability   Psychosis:  None   Duration of Psychotic symptoms: No data recorded  Trauma:  Detachment from others   Obsessions:  None   Compulsions:  None   Inattention:  None   Hyperactivity/Impulsivity:  None   Oppositional/Defiant Behaviors:  None   Emotional Irregularity:  None   Other Mood/Personality Symptoms:  No data recorded   Mental Status Exam Appearance and self-care  Stature:  Average   Weight:  Average weight   Clothing:  Casual   Grooming:  Normal   Cosmetic use:  Age appropriate   Posture/gait:  Normal   Motor activity:  Not Remarkable   Sensorium  Attention:  Normal   Concentration:  Normal   Orientation:  X5   Recall/memory:  Normal   Affect and Mood  Affect:  Congruent   Mood:  Anxious   Relating  Eye contact:  Normal   Facial expression:  Responsive   Attitude toward examiner:  Cooperative   Thought and Language  Speech flow: Clear and Coherent   Thought content:  Appropriate to Mood and Circumstances   Preoccupation:  None   Hallucinations:  None   Organization:  No data recorded  [place]  Fund of Knowledge:  Good   Intelligence:  Average   Abstraction:  Normal   Judgement:  Good   Reality Testing:  Adequate   Insight:  Good   Decision Making:  Normal   Social Functioning  Social Maturity:  Isolates   Social Judgement:  Normal   Stress  Stressors:  Grief/losses   Coping Ability:  Human Resources Officer Deficits:  Activities of daily living; Communication   Supports:  Family     Religion: Religion/Spirituality Are You A Religious Person?: No  Leisure/Recreation: Leisure / Recreation Do You Have  Hobbies?: No  Exercise/Diet: Exercise/Diet Do You Exercise?: No Have You Gained or Lost A Significant Amount of Weight in the Past Six Months?: No Do You Follow a Special Diet?: No Do You Have Any Trouble Sleeping?: No   CCA Employment/Education Employment/Work Situation: Employment / Work Situation Employment Situation: Unemployed  Education: Diplomatic Services Operational Officer: ben l programmer, multimedia and middle college at [employer] Did Garment/textile Technologist From [employer]?: Yes Did Theme Park Manager?: Yes What Type of College Degree Do you Have?: [employer]   CCA Family/Childhood History Family and Relationship History: Family history Marital status: Single Does patient have children?: No  Childhood History:  Childhood History By whom was/is the patient raised?: Both parents Additional childhood history information: client reported he was born and raised in [place]. client reported he has never had a positive male influence growing up. client reported there is more to discuss but not today. Patient's description of current relationship with people who raised him/her: client reported his father passed 4 years ago. client reported his mother is his main support now, they have a good relationship. Does patient have siblings?: No Did patient suffer any verbal/emotional/physical/sexual abuse as a child?: No Did patient suffer from severe childhood neglect?: No Has patient ever been sexually abused/assaulted/raped as an adolescent or adult?: No Was the patient ever a victim of a crime or a disaster?: No Witnessed domestic violence?: No Has patient been affected by domestic violence as an adult?: No  Child/Adolescent Assessment:     CCA Substance Use Alcohol/Drug Use: Alcohol / Drug Use History of alcohol / drug use?: Yes Substance #1 Name of Substance 1: crack 1 - Age of First Use: started in 2024 1 - Amount (size/oz): ukn 1 - Frequency: daily 1 - Last Use / Amount: last use 02/07/2024 1 -  Method of Aquiring: illegal 1- Route of Use: smoking Substance #2 Name of Substance 2: marijuana 2 - Age of First Use: ukn 2 - Amount (size/oz): ukn 2 - Frequency: weekly 2 - Last Use / Amount: 3 days ago 2 - Method of Aquiring: illegal 2 - Route of Substance Use: smoking                     ASAM's:  Six Dimensions of Multidimensional Assessment  Dimension 1:  Acute Intoxication and/or Withdrawal Potential:      Dimension 2:  Biomedical Conditions and Complications:      Dimension 3:  Emotional, Behavioral, or Cognitive Conditions and Complications:     Dimension 4:  Readiness to Change:     Dimension 5:  Relapse, Continued use, or Continued Problem Potential:     Dimension 6:  Recovery/Living Environment:     ASAM Severity Score:    ASAM Recommended Level of Treatment:     Substance use Disorder (SUD)    Recommendations for Services/Supports/Treatments: Recommendations for Services/Supports/Treatments Recommendations For Services/Supports/Treatments: Individual Therapy  DSM5 Diagnoses: Patient Active Problem List   Diagnosis Date Noted   Severe persistent asthma with exacerbation (HCC) 11/24/2022   Persistent circadian rhythm sleep disorder, shift work type 01/04/2015   Asthma, chronic 09/07/2014   Healthcare maintenance 09/07/2014   Tobacco abuse 10/23/2012    Patient Centered Plan: Patient is on the following Treatment Plan(s):  Depression   Referrals to Alternative Service(s): Referred to Alternative Service(s):   Place:   Date:   Time:    Referred to Alternative Service(s):   Place:   Date:   Time:    Referred to Alternative Service(s):   Place:   Date:   Time:    Referred to Alternative Service(s):   Place:   Date:   Time:      Collaboration of Care: Referral or follow-up with counselor/therapist AEB [id number]-OP  Patient/Guardian was advised Release of Information must be obtained prior to any record release in order to collaborate their care with  an outside provider. Patient/Guardian was advised if they have not already done so to contact the registration department to sign all necessary forms in order for us  to release information regarding their care.   Consent: Patient/Guardian gives verbal consent for treatment and assignment of benefits for services provided during this visit. Patient/Guardian expressed understanding and agreed to proceed.   Quayshaun Hubbert Y Terisa Belardo, LCSW

## 2024-02-15 ENCOUNTER — Telehealth (HOSPITAL_COMMUNITY): Payer: Self-pay

## 2024-02-15 NOTE — Telephone Encounter (Signed)
 Paige Cozart, LCSW referred this pt to this therapist as the pt is dually diagnosed. Therapist calls [facility] in an attempt to schedule him and reaches VM. Therapist leaves a HIPAA compliant message requesting a return call.  Darice Simpler, MS< LMFT, LCAS 02-15-24

## 2024-02-23 ENCOUNTER — Ambulatory Visit (HOSPITAL_COMMUNITY): Payer: Self-pay

## 2024-03-21 ENCOUNTER — Other Ambulatory Visit: Payer: Self-pay | Admitting: Nurse Practitioner

## 2024-03-21 DIAGNOSIS — J4541 Moderate persistent asthma with (acute) exacerbation: Secondary | ICD-10-CM

## 2024-04-06 ENCOUNTER — Ambulatory Visit (HOSPITAL_COMMUNITY): Payer: Self-pay | Admitting: Physician Assistant

## 2024-04-29 ENCOUNTER — Ambulatory Visit (HOSPITAL_COMMUNITY): Payer: Self-pay | Admitting: Physician Assistant

## 2024-05-12 ENCOUNTER — Ambulatory Visit (HOSPITAL_COMMUNITY): Payer: Self-pay | Admitting: Physician Assistant
